# Patient Record
Sex: Female | Born: 1935 | Race: Black or African American | Hispanic: No | Marital: Married | Smoking: Never smoker
Health system: Southern US, Community
[De-identification: ages and names within clinical notes are randomized; demographics above are authoritative.]

## PROBLEM LIST (undated history)

## (undated) DIAGNOSIS — E785 Hyperlipidemia, unspecified: Secondary | ICD-10-CM

## (undated) DIAGNOSIS — K219 Gastro-esophageal reflux disease without esophagitis: Secondary | ICD-10-CM

## (undated) DIAGNOSIS — T45515A Adverse effect of anticoagulants, initial encounter: Secondary | ICD-10-CM

## (undated) DIAGNOSIS — D6832 Hemorrhagic disorder due to extrinsic circulating anticoagulants: Secondary | ICD-10-CM

## (undated) DIAGNOSIS — N189 Chronic kidney disease, unspecified: Secondary | ICD-10-CM

## (undated) DIAGNOSIS — D649 Anemia, unspecified: Secondary | ICD-10-CM

## (undated) DIAGNOSIS — I10 Essential (primary) hypertension: Secondary | ICD-10-CM

## (undated) HISTORY — PX: PACEMAKER INSERTION: SHX728

## (undated) HISTORY — PX: AORTIC VALVE REPLACEMENT: SHX41

## (undated) HISTORY — DX: Essential (primary) hypertension: I10

## (undated) HISTORY — DX: Hyperlipidemia, unspecified: E78.5

## (undated) HISTORY — DX: Adverse effect of anticoagulants, initial encounter: T45.515A

## (undated) HISTORY — DX: Hemorrhagic disorder due to extrinsic circulating anticoagulants: D68.32

## (undated) HISTORY — DX: Anemia, unspecified: D64.9

## (undated) HISTORY — DX: Gastro-esophageal reflux disease without esophagitis: K21.9

## (undated) HISTORY — DX: Chronic kidney disease, unspecified: N18.9

---

## 2003-11-19 ENCOUNTER — Other Ambulatory Visit: Payer: Self-pay

## 2004-06-28 ENCOUNTER — Emergency Department: Payer: Self-pay | Admitting: Emergency Medicine

## 2004-06-28 ENCOUNTER — Ambulatory Visit: Payer: Self-pay | Admitting: Urology

## 2005-07-26 ENCOUNTER — Ambulatory Visit: Payer: Self-pay | Admitting: Internal Medicine

## 2006-08-16 ENCOUNTER — Ambulatory Visit: Payer: Self-pay | Admitting: Internal Medicine

## 2007-09-20 ENCOUNTER — Ambulatory Visit: Payer: Self-pay | Admitting: Internal Medicine

## 2008-12-02 ENCOUNTER — Ambulatory Visit: Payer: Self-pay | Admitting: Internal Medicine

## 2009-04-24 ENCOUNTER — Ambulatory Visit: Payer: Self-pay | Admitting: Internal Medicine

## 2009-05-08 ENCOUNTER — Ambulatory Visit: Payer: Self-pay | Admitting: Internal Medicine

## 2009-05-25 ENCOUNTER — Ambulatory Visit: Payer: Self-pay | Admitting: Internal Medicine

## 2009-07-10 ENCOUNTER — Ambulatory Visit: Payer: Self-pay | Admitting: Unknown Physician Specialty

## 2009-07-25 ENCOUNTER — Ambulatory Visit: Payer: Self-pay | Admitting: Internal Medicine

## 2009-07-31 ENCOUNTER — Ambulatory Visit: Payer: Self-pay | Admitting: Internal Medicine

## 2009-08-25 ENCOUNTER — Ambulatory Visit: Payer: Self-pay | Admitting: Internal Medicine

## 2009-09-22 ENCOUNTER — Ambulatory Visit: Payer: Self-pay | Admitting: Internal Medicine

## 2009-10-05 ENCOUNTER — Ambulatory Visit: Payer: Self-pay | Admitting: Internal Medicine

## 2009-10-23 ENCOUNTER — Ambulatory Visit: Payer: Self-pay | Admitting: Internal Medicine

## 2009-11-04 ENCOUNTER — Ambulatory Visit: Payer: Self-pay | Admitting: Internal Medicine

## 2009-12-07 ENCOUNTER — Ambulatory Visit: Payer: Self-pay | Admitting: Internal Medicine

## 2009-12-23 ENCOUNTER — Ambulatory Visit: Payer: Self-pay | Admitting: Internal Medicine

## 2010-06-04 ENCOUNTER — Ambulatory Visit: Payer: Self-pay | Admitting: Internal Medicine

## 2010-07-12 ENCOUNTER — Ambulatory Visit: Payer: Self-pay | Admitting: Internal Medicine

## 2010-07-17 ENCOUNTER — Other Ambulatory Visit: Payer: Self-pay | Admitting: Internal Medicine

## 2011-03-14 ENCOUNTER — Ambulatory Visit (INDEPENDENT_AMBULATORY_CARE_PROVIDER_SITE_OTHER): Payer: Medicare Other | Admitting: Internal Medicine

## 2011-03-14 DIAGNOSIS — Z7901 Long term (current) use of anticoagulants: Secondary | ICD-10-CM

## 2011-03-15 NOTE — Progress Notes (Signed)
  Subjective:    Patient ID: Katie Mcintosh, female    DOB: 05-06-1934, 77 y.o.   MRN: [id number]  HPI  Patient is here for a PT/INR only lab draw    Review of Systems     Objective:   Physical Exam        Assessment & Plan:

## 2011-03-16 NOTE — Progress Notes (Signed)
Patient notified. Appt scheduled for 1 month.

## 2011-03-29 ENCOUNTER — Other Ambulatory Visit: Payer: Self-pay | Admitting: Internal Medicine

## 2011-03-29 MED ORDER — EZETIMIBE 10 MG PO TABS: 10.0000 mg | ORAL_TABLET | Freq: Every day | ORAL | Status: DC

## 2011-04-06 ENCOUNTER — Encounter: Payer: Self-pay | Admitting: Internal Medicine

## 2011-04-18 ENCOUNTER — Other Ambulatory Visit (INDEPENDENT_AMBULATORY_CARE_PROVIDER_SITE_OTHER): Payer: Medicare Other | Admitting: *Deleted

## 2011-04-18 DIAGNOSIS — Z7901 Long term (current) use of anticoagulants: Secondary | ICD-10-CM

## 2011-04-18 LAB — PROTIME-INR
INR: 2.21 — ABNORMAL HIGH (ref <1.50)
Prothrombin Time: 25.3 seconds — ABNORMAL HIGH (ref 11.6–15.2)

## 2011-04-18 NOTE — Progress Notes (Signed)
Addended by: Alois Cliche on: 04/18/2011 02:32 PM   Modules accepted: Orders

## 2011-04-27 ENCOUNTER — Other Ambulatory Visit: Payer: Self-pay | Admitting: Internal Medicine

## 2011-04-27 MED ORDER — WARFARIN SODIUM 6 MG PO TABS: 6.0000 mg | ORAL_TABLET | Freq: Every day | ORAL | Status: DC

## 2011-04-29 ENCOUNTER — Other Ambulatory Visit: Payer: Self-pay | Admitting: Internal Medicine

## 2011-04-29 MED ORDER — WARFARIN SODIUM 6 MG PO TABS: 6.0000 mg | ORAL_TABLET | Freq: Every day | ORAL | Status: DC

## 2011-05-18 ENCOUNTER — Other Ambulatory Visit (INDEPENDENT_AMBULATORY_CARE_PROVIDER_SITE_OTHER): Payer: Medicare Other | Admitting: *Deleted

## 2011-05-18 DIAGNOSIS — Z7901 Long term (current) use of anticoagulants: Secondary | ICD-10-CM

## 2011-05-18 LAB — PROTIME-INR: INR: 3 ratio — ABNORMAL HIGH (ref 0.8–1.0)

## 2011-06-13 ENCOUNTER — Telehealth: Payer: Self-pay | Admitting: Internal Medicine

## 2011-06-13 DIAGNOSIS — Z1239 Encounter for other screening for malignant neoplasm of breast: Secondary | ICD-10-CM

## 2011-06-13 NOTE — Telephone Encounter (Signed)
Is it okay to put in order and fax to [facility]?

## 2011-06-13 NOTE — Telephone Encounter (Signed)
Yes, i put order for mamogram  on [facility] desk

## 2011-06-14 NOTE — Telephone Encounter (Signed)
Order faxed.

## 2011-06-20 ENCOUNTER — Other Ambulatory Visit (INDEPENDENT_AMBULATORY_CARE_PROVIDER_SITE_OTHER): Payer: Medicare Other | Admitting: *Deleted

## 2011-06-20 DIAGNOSIS — Z7901 Long term (current) use of anticoagulants: Secondary | ICD-10-CM

## 2011-06-20 LAB — PROTIME-INR
INR: 8.6 ratio (ref 0.8–1.0)
Prothrombin Time: 96.5 s (ref 10.2–12.4)

## 2011-06-22 ENCOUNTER — Other Ambulatory Visit (INDEPENDENT_AMBULATORY_CARE_PROVIDER_SITE_OTHER): Payer: Medicare Other | Admitting: *Deleted

## 2011-06-22 DIAGNOSIS — Z7901 Long term (current) use of anticoagulants: Secondary | ICD-10-CM

## 2011-06-22 LAB — PROTIME-INR: Prothrombin Time: 48.8 s — ABNORMAL HIGH (ref 10.2–12.4)

## 2011-06-23 ENCOUNTER — Other Ambulatory Visit: Payer: Self-pay | Admitting: Internal Medicine

## 2011-06-23 MED ORDER — WARFARIN SODIUM 5 MG PO TABS: 5.0000 mg | ORAL_TABLET | Freq: Every day | ORAL | Status: DC

## 2011-06-30 ENCOUNTER — Other Ambulatory Visit (INDEPENDENT_AMBULATORY_CARE_PROVIDER_SITE_OTHER): Payer: Medicare Other | Admitting: *Deleted

## 2011-06-30 DIAGNOSIS — Z7901 Long term (current) use of anticoagulants: Secondary | ICD-10-CM

## 2011-07-01 LAB — PROTIME-INR
INR: 1.7 ratio — ABNORMAL HIGH (ref 0.8–1.0)
Prothrombin Time: 18.9 s — ABNORMAL HIGH (ref 10.2–12.4)

## 2011-07-01 NOTE — Progress Notes (Signed)
Addended by: Alois Cliche on: 07/01/2011 10:24 AM   Modules accepted: Orders

## 2011-07-11 DIAGNOSIS — Z952 Presence of prosthetic heart valve: Secondary | ICD-10-CM | POA: Insufficient documentation

## 2011-07-11 DIAGNOSIS — I10 Essential (primary) hypertension: Secondary | ICD-10-CM | POA: Insufficient documentation

## 2011-07-11 DIAGNOSIS — I219 Acute myocardial infarction, unspecified: Secondary | ICD-10-CM | POA: Insufficient documentation

## 2011-07-11 DIAGNOSIS — I5042 Chronic combined systolic (congestive) and diastolic (congestive) heart failure: Secondary | ICD-10-CM | POA: Insufficient documentation

## 2011-07-11 DIAGNOSIS — I2510 Atherosclerotic heart disease of native coronary artery without angina pectoris: Secondary | ICD-10-CM | POA: Insufficient documentation

## 2011-07-11 DIAGNOSIS — Z950 Presence of cardiac pacemaker: Secondary | ICD-10-CM | POA: Insufficient documentation

## 2011-07-11 DIAGNOSIS — I482 Chronic atrial fibrillation, unspecified: Secondary | ICD-10-CM | POA: Insufficient documentation

## 2011-07-15 ENCOUNTER — Other Ambulatory Visit (INDEPENDENT_AMBULATORY_CARE_PROVIDER_SITE_OTHER): Payer: Medicare Other | Admitting: *Deleted

## 2011-07-15 DIAGNOSIS — Z7901 Long term (current) use of anticoagulants: Secondary | ICD-10-CM

## 2011-07-15 LAB — PROTIME-INR: Prothrombin Time: 40.7 s — ABNORMAL HIGH (ref 10.2–12.4)

## 2011-07-25 ENCOUNTER — Telehealth: Payer: Self-pay | Admitting: *Deleted

## 2011-07-25 DIAGNOSIS — Z7901 Long term (current) use of anticoagulants: Secondary | ICD-10-CM

## 2011-07-25 NOTE — Telephone Encounter (Signed)
I am confused.  She was advised by me through her husband on 12/21 to change her dose to 5 mg daily bc her inr was too high,  And repeat in 2 weeks.  Please clarify what has transpires since  Then bc nothing else is charted

## 2011-07-25 NOTE — Telephone Encounter (Signed)
Her husband didn't give her your instructions. She says he told her that she was to stop coumadin all together.

## 2011-07-25 NOTE — Telephone Encounter (Signed)
Advised pt of protime results.  She says she has not had any coumadin since Wednesday 12/26.  She had been taking 6 mg's daily except on wed and thursdays, when she takes 9 mg's.  She thinks this is too high a dose and thinks she needs to take a lower dose.  Please advise.

## 2011-07-25 NOTE — Telephone Encounter (Signed)
Ask her to take 9 mg tonight, since she is now probably undertherapeutic,  And then start 6 mg daily tomorrow,  Return for PT/INR next Tuesday,  thanks

## 2011-07-25 NOTE — Telephone Encounter (Signed)
Advised pt, lab appt scheduled for next week.

## 2011-08-02 ENCOUNTER — Telehealth: Payer: Self-pay | Admitting: *Deleted

## 2011-08-02 ENCOUNTER — Other Ambulatory Visit (INDEPENDENT_AMBULATORY_CARE_PROVIDER_SITE_OTHER): Payer: Medicare Other | Admitting: *Deleted

## 2011-08-02 DIAGNOSIS — Z7901 Long term (current) use of anticoagulants: Secondary | ICD-10-CM

## 2011-08-02 LAB — PROTIME-INR
INR: 2.1 ratio — ABNORMAL HIGH (ref 0.8–1.0)
Prothrombin Time: 23.1 s — ABNORMAL HIGH (ref 10.2–12.4)

## 2011-08-02 NOTE — Telephone Encounter (Signed)
Pt has brought in form for handicapped placard, form is in your red folder.

## 2011-08-03 NOTE — Telephone Encounter (Signed)
Completed form placed up front for pick up, pt advised.

## 2011-08-04 NOTE — Progress Notes (Signed)
Addended by: Deborra Medina on: 08/04/2011 01:17 PM   Modules accepted: Orders

## 2011-08-11 ENCOUNTER — Telehealth: Payer: Self-pay | Admitting: Internal Medicine

## 2011-08-11 NOTE — Telephone Encounter (Signed)
thank you !!

## 2011-08-11 NOTE — Telephone Encounter (Signed)
Spoke with patient, she has dry cough, sneezing, chest feels tight in the morning. Symptoms x one week.  No fever.  Advised pt it sounds like she has a cold.  Suggested that she try mucous relief expectorant, along with plenty of fluids, for cough and congestion. She will call back if not better.

## 2011-08-11 NOTE — Telephone Encounter (Signed)
Cold in head and chest wants something called into the pharmacy

## 2011-08-22 ENCOUNTER — Ambulatory Visit: Payer: Self-pay | Admitting: Internal Medicine

## 2011-08-22 LAB — HM MAMMOGRAPHY: HM Mammogram: NORMAL

## 2011-08-25 ENCOUNTER — Other Ambulatory Visit: Payer: Self-pay | Admitting: *Deleted

## 2011-08-25 MED ORDER — EZETIMIBE 10 MG PO TABS: 10.0000 mg | ORAL_TABLET | Freq: Every day | ORAL | Status: DC

## 2011-08-25 MED ORDER — CARVEDILOL 12.5 MG PO TABS: 12.5000 mg | ORAL_TABLET | Freq: Two times a day (BID) | ORAL | Status: DC

## 2011-09-05 ENCOUNTER — Other Ambulatory Visit (INDEPENDENT_AMBULATORY_CARE_PROVIDER_SITE_OTHER): Payer: Medicare Other | Admitting: *Deleted

## 2011-09-05 ENCOUNTER — Telehealth: Payer: Self-pay | Admitting: *Deleted

## 2011-09-05 DIAGNOSIS — Z7901 Long term (current) use of anticoagulants: Secondary | ICD-10-CM

## 2011-09-05 NOTE — Telephone Encounter (Signed)
Santiago Glad from [facility] lab called - PT 68.7 INR 6.2. I called patient. She has not changed her diet, not on any antibiotics or other med changes and currently taking coumadin 6 mg on all days except Wed which she takes 6.5 mg. She has not taken her coumadin yet today.

## 2011-09-05 NOTE — Telephone Encounter (Signed)
Patient informed, scheduled for lab Thursday

## 2011-09-05 NOTE — Telephone Encounter (Signed)
Thank you.  Please have her suspend all coumadin and have her PT/INR rechecked on Thursday

## 2011-09-08 ENCOUNTER — Other Ambulatory Visit (INDEPENDENT_AMBULATORY_CARE_PROVIDER_SITE_OTHER): Payer: Medicare Other | Admitting: *Deleted

## 2011-09-08 DIAGNOSIS — Z7901 Long term (current) use of anticoagulants: Secondary | ICD-10-CM

## 2011-09-12 ENCOUNTER — Encounter: Payer: Self-pay | Admitting: Internal Medicine

## 2011-09-12 ENCOUNTER — Ambulatory Visit (INDEPENDENT_AMBULATORY_CARE_PROVIDER_SITE_OTHER): Payer: Medicare Other | Admitting: Internal Medicine

## 2011-09-12 VITALS — BP 128/68 | HR 68 | Temp 98.2°F | Wt 158.0 lb

## 2011-09-12 DIAGNOSIS — Z13820 Encounter for screening for osteoporosis: Secondary | ICD-10-CM

## 2011-09-12 DIAGNOSIS — I10 Essential (primary) hypertension: Secondary | ICD-10-CM

## 2011-09-12 DIAGNOSIS — K219 Gastro-esophageal reflux disease without esophagitis: Secondary | ICD-10-CM

## 2011-09-12 DIAGNOSIS — E785 Hyperlipidemia, unspecified: Secondary | ICD-10-CM

## 2011-09-12 DIAGNOSIS — D649 Anemia, unspecified: Secondary | ICD-10-CM

## 2011-09-12 DIAGNOSIS — N189 Chronic kidney disease, unspecified: Secondary | ICD-10-CM | POA: Insufficient documentation

## 2011-09-12 DIAGNOSIS — E875 Hyperkalemia: Secondary | ICD-10-CM

## 2011-09-12 DIAGNOSIS — Z7901 Long term (current) use of anticoagulants: Secondary | ICD-10-CM

## 2011-09-12 DIAGNOSIS — N289 Disorder of kidney and ureter, unspecified: Secondary | ICD-10-CM

## 2011-09-12 NOTE — Assessment & Plan Note (Signed)
Secondary to CKD, with negative FOBT in July 2012.

## 2011-09-12 NOTE — Assessment & Plan Note (Signed)
Well controlled currently; no changes today

## 2011-09-12 NOTE — Assessment & Plan Note (Signed)
Managed with Zetia,  With no lipids in over 6 months

## 2011-09-12 NOTE — Assessment & Plan Note (Signed)
Managed with daily omeprazole

## 2011-09-12 NOTE — Patient Instructions (Signed)
Pleasefreturn in 2 weeks for a coumadin check,  And followup with Dr Candiss Norse on your anemia  We will call you with your potassium level.,

## 2011-09-12 NOTE — Assessment & Plan Note (Signed)
Mild, by December labs.  May be due to diet and CKD since she is not taking any medications which are responsible Repeating today

## 2011-09-12 NOTE — Progress Notes (Signed)
Subjective:    Patient ID: Katie Mcintosh, female    DOB: 10/12/1933, 78 y.o.   MRN: [id number]  HPI  Katie Mcintosh is a 78 yr old AA female with a history of aortic valve replacement, on chronic coumadin, cardiomyopathy with reduced LV function who is  here to followup on chronic disease including CKD, aortic valve replacement and DJD.  She had d labs in december from [place]  which were notable for anemia and mild hyperkalemia, with Cr  of 1.8   .Her cc insomnia with nocturia x 3, chronic low back and knee pain.  Not exercising or walking regularly  Gets short of breath with vacuuming.   Past Medical History  Diagnosis Date  . Chronic kidney disease   . Anemia   . Hyperlipidemia   . GERD (gastroesophageal reflux disease)   . Hypertension    Current Outpatient Prescriptions on File Prior to Visit  Medication Sig Dispense Refill  . carvedilol (COREG) 12.5 MG tablet Take 1 tablet (12.5 mg total) by mouth 2 (two) times daily with a meal.  60 tablet  3  . ezetimibe (ZETIA) 10 MG tablet Take 1 tablet (10 mg total) by mouth daily.  30 tablet  3  . warfarin (COUMADIN) 5 MG tablet Take 6 mg by mouth daily. Take 9 mg on Wednesday s, 6 mg all other days         Review of Systems  Constitutional: Negative for fever, chills and unexpected weight change.  HENT: Negative for hearing loss, ear pain, nosebleeds, congestion, sore throat, facial swelling, rhinorrhea, sneezing, mouth sores, trouble swallowing, neck pain, neck stiffness, voice change, postnasal drip, sinus pressure, tinnitus and ear discharge.   Eyes: Negative for pain, discharge, redness and visual disturbance.  Respiratory: Positive for shortness of breath. Negative for cough, chest tightness, wheezing and stridor.   Cardiovascular: Negative for chest pain, palpitations and leg swelling.  Musculoskeletal: Positive for arthralgias. Negative for myalgias.  Skin: Negative for color change and rash.  Neurological: Negative for dizziness,  weakness, light-headedness and headaches.  Hematological: Negative for adenopathy.       Objective:   Physical Exam  Constitutional: She is oriented to person, place, and time. She appears well-developed and well-nourished.  HENT:  Mouth/Throat: Oropharynx is clear and moist.  Eyes: EOM are normal. Pupils are equal, round, and reactive to light. No scleral icterus.  Neck: Normal range of motion. Neck supple. No JVD present. No thyromegaly present.  Cardiovascular: Normal rate, regular rhythm, normal heart sounds and intact distal pulses.   Pulmonary/Chest: Effort normal and breath sounds normal.  Abdominal: Soft. Bowel sounds are normal. She exhibits no mass. There is no tenderness.  Musculoskeletal: Normal range of motion. She exhibits no edema.  Lymphadenopathy:    She has no cervical adenopathy.  Neurological: She is alert and oriented to person, place, and time.  Skin: Skin is warm and dry.  Psychiatric: She has a normal mood and affect.       Assessment & Plan:   Anemia Secondary to CKD, with negative FOBT in July 2012.     GERD (gastroesophageal reflux disease) Managed with daily omeprazole  Hyperlipidemia Managed with Zetia,  With no lipids in over 6 months  Hypertension Well controlled currently; no changes today  Hyperkalemia Mild, by December labs.  May be due to diet and CKD since she is not taking any medications which are responsible Repeating today    Updated Medication List Outpatient Encounter Prescriptions as of 09/12/2011  Medication Sig Dispense Refill  . amiloride-hydrochlorothiazide (MODURETIC) 5-50 MG tablet Take 1 tablet by mouth daily.      . carvedilol (COREG) 12.5 MG tablet Take 1 tablet (12.5 mg total) by mouth 2 (two) times daily with a meal.  60 tablet  3  . ezetimibe (ZETIA) 10 MG tablet Take 1 tablet (10 mg total) by mouth daily.  30 tablet  3  . omeprazole (PRILOSEC) 20 MG capsule Take 20 mg by mouth daily.      . sodium bicarbonate  650 MG tablet Take 650 mg by mouth 2 (two) times daily.      Marland Kitchen warfarin (COUMADIN) 5 MG tablet Take 6 mg by mouth daily. Take 9 mg on Wednesday s, 6 mg all other days

## 2011-09-13 ENCOUNTER — Other Ambulatory Visit: Payer: Self-pay | Admitting: Internal Medicine

## 2011-09-13 LAB — COMPLETE METABOLIC PANEL WITH GFR
ALT: 13 U/L (ref 0–35)
Albumin: 4.2 g/dL (ref 3.5–5.2)
CO2: 22 mEq/L (ref 19–32)
Calcium: 9.3 mg/dL (ref 8.4–10.5)
Chloride: 110 mEq/L (ref 96–112)
GFR, Est African American: 29 mL/min — ABNORMAL LOW
Potassium: 5.8 mEq/L — ABNORMAL HIGH (ref 3.5–5.3)
Sodium: 142 mEq/L (ref 135–145)
Total Bilirubin: 0.4 mg/dL (ref 0.3–1.2)
Total Protein: 7.4 g/dL (ref 6.0–8.3)

## 2011-09-13 NOTE — Patient Instructions (Signed)
Potassium Content of Foods Potassium is a mineral. The body needs potassium to control blood pressure and to keep the muscles and nervous system healthy. Most foods contain potassium. Eating a variety of foods in the right amounts will help control the level of potassium in your body. Kidney problems can cause there to be too much potassium in the body. If this happens, you may need to lower the amount of potassium in your diet. Some medicines such as diuretics may cause your body to lose too much potassium. If this happens, you may need to increase the amount of potassium in your diet. COMMON SERVING SIZES The list below tells you how big or small some common portion sizes are:  1 oz.........4 stacked dice.     3 oz........Marland KitchenDeck of cards.     1 tsp.......Marland KitchenTip of little finger.     1 tbs......Marland KitchenMarland KitchenThumb.     2 tbs.......Marland KitchenGolf ball.      cup......Marland KitchenHalf of a fist.     1 cup.......Marland KitchenA fist.  FOODS HIGH IN POTASSIUM More than 250 mg per serving.  Bran cereals and other bran products.     Milk (skim, 1%, 2%, whole).     Buttermilk.    Yogurt.    Avocados.    Bananas.    Dried fruits.     Kiwis.    Oranges.    Prunes.    Raisins.    Baked beans.     Spinach.    Tomatoes.    Peanut butter.     Nuts.    Tofu.    Potatoes.  FOODS MODERATE IN POTASSIUM Between 150 to 250mg  per serving.  Cherries.     Mangoes.    Asparagus.    Peas.    Zucchini.    Celery.    [place].    Peaches, fresh.     Broccoli stalks.     Peppers.    Figs.    Pears, fresh.     Kale.    Summer squashes.  FOODS LOW IN POTASSIUM Less than 150mg  per serving.  Pasta.     Rice.    Cottage cheese.     Cheddar cheese.     Apples.    Grapes.    Pineapple.    Raspberries.    Strawberries.    Watermelon.    Green beans.     Cabbage.    Cauliflower.    Corn.    Mushrooms.    Onions.    Eggs.  Document Released: 02/22/2005 Document Revised:  03/23/2011 Document Reviewed: 06/19/2007 [facility] Patient Information 2012 [place].

## 2011-09-15 ENCOUNTER — Telehealth: Payer: Self-pay | Admitting: Internal Medicine

## 2011-09-15 ENCOUNTER — Other Ambulatory Visit: Payer: Self-pay | Admitting: *Deleted

## 2011-09-15 MED ORDER — OMEPRAZOLE 20 MG PO CPDR: 20.0000 mg | DELAYED_RELEASE_CAPSULE | Freq: Every day | ORAL | Status: AC

## 2011-09-15 NOTE — Telephone Encounter (Signed)
Katie Mcintosh has taken care of this.

## 2011-09-15 NOTE — Telephone Encounter (Signed)
Office Message [address] [place], [place] [address] p. [phone] f. [phone] To: [place] (Daytime Triage) Fax: [phone] From: [facility] Date/ Time: 09/15/2011 10:13 AM Taken By: Ria Comment Renegar, CSR Caller: [place]: not collected Patient: Katie, Mcintosh DOB: 1934-05-10 Phone: [phone] Reason for Call: Had a call from the office. She asked to have Dr. Lupita Dawn nurse call her back at [phone] Regarding Appointment: Appt Date: Appt Time: Unknown Provider: Reason: Details: Outcome

## 2011-09-26 ENCOUNTER — Other Ambulatory Visit: Payer: Medicare Other

## 2011-10-11 ENCOUNTER — Other Ambulatory Visit (INDEPENDENT_AMBULATORY_CARE_PROVIDER_SITE_OTHER): Payer: Medicare Other | Admitting: *Deleted

## 2011-10-11 DIAGNOSIS — Z7901 Long term (current) use of anticoagulants: Secondary | ICD-10-CM

## 2011-10-11 LAB — PROTIME-INR
INR: 4.2 ratio — ABNORMAL HIGH (ref 0.8–1.0)
Prothrombin Time: 46.2 s — ABNORMAL HIGH (ref 10.2–12.4)

## 2011-10-26 ENCOUNTER — Telehealth: Payer: Self-pay | Admitting: Internal Medicine

## 2011-10-26 ENCOUNTER — Other Ambulatory Visit (INDEPENDENT_AMBULATORY_CARE_PROVIDER_SITE_OTHER): Payer: Medicare Other | Admitting: *Deleted

## 2011-10-26 DIAGNOSIS — Z7901 Long term (current) use of anticoagulants: Secondary | ICD-10-CM

## 2011-10-26 NOTE — Telephone Encounter (Signed)
Santiago Glad called on patient for a critical lab.  PT: 53.00    INR: 4.76

## 2011-10-26 NOTE — Telephone Encounter (Signed)
I have left two messages on two different phone number for patient to return my call as soon as possible.

## 2011-10-26 NOTE — Telephone Encounter (Signed)
Her coumadin level is way too high.  Stop the coumaidn repeat INR on Friday

## 2011-10-27 NOTE — Telephone Encounter (Signed)
Patient did not return my call, I called her back this morning and advised her to stop the coumadin and repeat her lab tomorrow.

## 2011-10-28 ENCOUNTER — Telehealth: Payer: Self-pay | Admitting: Internal Medicine

## 2011-10-28 ENCOUNTER — Other Ambulatory Visit: Payer: Medicare Other

## 2011-10-28 ENCOUNTER — Other Ambulatory Visit (INDEPENDENT_AMBULATORY_CARE_PROVIDER_SITE_OTHER): Payer: Medicare Other

## 2011-10-28 DIAGNOSIS — Z7901 Long term (current) use of anticoagulants: Secondary | ICD-10-CM

## 2011-10-28 DIAGNOSIS — I10 Essential (primary) hypertension: Secondary | ICD-10-CM

## 2011-10-28 NOTE — Telephone Encounter (Signed)
Per Dr. Derrel Nip patient is to be off of coumadin over the weekend and recheck PT/INR on Monday.  Patient has been notified.

## 2011-10-28 NOTE — Telephone Encounter (Signed)
Lab called for critical on patient   PT: 55.9   INR: 5.0

## 2011-10-31 ENCOUNTER — Other Ambulatory Visit (INDEPENDENT_AMBULATORY_CARE_PROVIDER_SITE_OTHER): Payer: Medicare Other | Admitting: *Deleted

## 2011-10-31 DIAGNOSIS — Z7901 Long term (current) use of anticoagulants: Secondary | ICD-10-CM

## 2011-11-01 MED ORDER — WARFARIN SODIUM 5 MG PO TABS: 5.0000 mg | ORAL_TABLET | Freq: Every day | ORAL | Status: DC

## 2011-11-01 NOTE — Progress Notes (Signed)
Addended by: Gildardo Pounds A on: 11/01/2011 03:56 PM   Modules accepted: Orders

## 2011-11-14 ENCOUNTER — Other Ambulatory Visit (INDEPENDENT_AMBULATORY_CARE_PROVIDER_SITE_OTHER): Payer: Medicare Other | Admitting: *Deleted

## 2011-11-14 DIAGNOSIS — Z7901 Long term (current) use of anticoagulants: Secondary | ICD-10-CM

## 2011-11-14 LAB — PROTIME-INR
INR: 2 ratio — ABNORMAL HIGH (ref 0.8–1.0)
Prothrombin Time: 21.6 s — ABNORMAL HIGH (ref 10.2–12.4)

## 2011-11-29 ENCOUNTER — Other Ambulatory Visit (INDEPENDENT_AMBULATORY_CARE_PROVIDER_SITE_OTHER): Payer: Medicare Other | Admitting: *Deleted

## 2011-11-29 ENCOUNTER — Other Ambulatory Visit: Payer: Self-pay | Admitting: Internal Medicine

## 2011-11-29 DIAGNOSIS — Z7901 Long term (current) use of anticoagulants: Secondary | ICD-10-CM

## 2011-11-29 LAB — PROTIME-INR: INR: 3.9 ratio — ABNORMAL HIGH (ref 0.8–1.0)

## 2011-11-29 MED ORDER — WARFARIN SODIUM 4 MG PO TABS: 4.0000 mg | ORAL_TABLET | Freq: Every day | ORAL | Status: DC

## 2011-12-12 ENCOUNTER — Other Ambulatory Visit (INDEPENDENT_AMBULATORY_CARE_PROVIDER_SITE_OTHER): Payer: Medicare Other | Admitting: *Deleted

## 2011-12-12 DIAGNOSIS — Z7901 Long term (current) use of anticoagulants: Secondary | ICD-10-CM

## 2011-12-12 LAB — PROTIME-INR: Prothrombin Time: 15.6 s — ABNORMAL HIGH (ref 10.2–12.4)

## 2011-12-13 ENCOUNTER — Other Ambulatory Visit: Payer: Self-pay | Admitting: Internal Medicine

## 2011-12-13 MED ORDER — WARFARIN SODIUM 4 MG PO TABS: ORAL_TABLET | ORAL | Status: DC

## 2011-12-26 ENCOUNTER — Other Ambulatory Visit (INDEPENDENT_AMBULATORY_CARE_PROVIDER_SITE_OTHER): Payer: Medicare Other | Admitting: *Deleted

## 2011-12-26 DIAGNOSIS — Z7901 Long term (current) use of anticoagulants: Secondary | ICD-10-CM

## 2011-12-26 LAB — PROTIME-INR: Prothrombin Time: 21.8 s — ABNORMAL HIGH (ref 10.2–12.4)

## 2011-12-27 ENCOUNTER — Encounter: Payer: Self-pay | Admitting: Internal Medicine

## 2011-12-27 ENCOUNTER — Other Ambulatory Visit: Payer: Self-pay | Admitting: *Deleted

## 2011-12-27 MED ORDER — WARFARIN SODIUM 4 MG PO TABS: ORAL_TABLET | ORAL | Status: DC

## 2011-12-27 NOTE — Telephone Encounter (Signed)
Patient requested Rx refill.

## 2012-01-04 ENCOUNTER — Encounter: Payer: Self-pay | Admitting: Internal Medicine

## 2012-01-11 ENCOUNTER — Other Ambulatory Visit (INDEPENDENT_AMBULATORY_CARE_PROVIDER_SITE_OTHER): Payer: Medicare Other | Admitting: *Deleted

## 2012-01-11 DIAGNOSIS — Z7901 Long term (current) use of anticoagulants: Secondary | ICD-10-CM

## 2012-01-11 LAB — PROTIME-INR
INR: 3.4 ratio — ABNORMAL HIGH (ref 0.8–1.0)
Prothrombin Time: 37.7 s — ABNORMAL HIGH (ref 10.2–12.4)

## 2012-01-24 ENCOUNTER — Telehealth: Payer: Self-pay | Admitting: Internal Medicine

## 2012-01-24 MED ORDER — CARVEDILOL 12.5 MG PO TABS: 12.5000 mg | ORAL_TABLET | Freq: Two times a day (BID) | ORAL | Status: AC

## 2012-01-24 NOTE — Telephone Encounter (Signed)
Patient came by office she is out of her Carvedilol 12.5 mg, and her warfarin sodium 4mg , she uses [facility], she states that she was suppose to get these through mail.  That is what she was told last week, [facility] she isn't sure where they are located they are a mail order, through her [facility] plan.  She is completely out, please call this months into [facility] and next month send to the mail order company.  Please call patient when this has been done.

## 2012-01-24 NOTE — Telephone Encounter (Signed)
We can't pull up Katie Mcintosh in the system and it would be best for her to pick up the rxs and mail them in. She just wants them to be sent to [facility] . She doesn't need the carvdilol so rx for warfarin sent in.

## 2012-02-06 ENCOUNTER — Other Ambulatory Visit: Payer: Self-pay | Admitting: Internal Medicine

## 2012-02-09 ENCOUNTER — Other Ambulatory Visit (INDEPENDENT_AMBULATORY_CARE_PROVIDER_SITE_OTHER): Payer: Medicare Other | Admitting: *Deleted

## 2012-02-09 DIAGNOSIS — Z7901 Long term (current) use of anticoagulants: Secondary | ICD-10-CM

## 2012-02-09 DIAGNOSIS — Z79899 Other long term (current) drug therapy: Secondary | ICD-10-CM

## 2012-02-09 LAB — PROTIME-INR: Prothrombin Time: 12.3 s (ref 10.2–12.4)

## 2012-02-10 ENCOUNTER — Telehealth: Payer: Self-pay | Admitting: Internal Medicine

## 2012-02-10 NOTE — Telephone Encounter (Signed)
Daughter called in and states that her moms home phone is out and would like you to call her on the cell of daughters number when you call her back to day.  Daughter states that patient was in yesterday and was told she would more than likely get a call back today mid day.

## 2012-02-10 NOTE — Telephone Encounter (Signed)
Will call daughter's number when Dr. Derrel Nip answers to labs.

## 2012-02-27 ENCOUNTER — Telehealth: Payer: Self-pay | Admitting: *Deleted

## 2012-02-27 ENCOUNTER — Other Ambulatory Visit (INDEPENDENT_AMBULATORY_CARE_PROVIDER_SITE_OTHER): Payer: Medicare Other | Admitting: *Deleted

## 2012-02-27 ENCOUNTER — Other Ambulatory Visit: Payer: Self-pay | Admitting: *Deleted

## 2012-02-27 DIAGNOSIS — Z7901 Long term (current) use of anticoagulants: Secondary | ICD-10-CM

## 2012-02-27 LAB — PROTIME-INR
INR: 6.3 ratio (ref 0.8–1.0)
Prothrombin Time: 70.3 s (ref 10.2–12.4)

## 2012-02-27 MED ORDER — EZETIMIBE 10 MG PO TABS: 10.0000 mg | ORAL_TABLET | Freq: Every day | ORAL | Status: DC

## 2012-02-27 NOTE — Telephone Encounter (Signed)
In regards to note below patient stated that she is not having in bleeding and she will come in in on Thursday for labs.

## 2012-02-27 NOTE — Telephone Encounter (Signed)
Left detailed message notifying patient. Also asked that she call back. If I do not hear from her tonight before I leave, I will try calling her again.

## 2012-02-27 NOTE — Telephone Encounter (Signed)
[facility] lab called with critical pt of 70.3 and INR of 6.29

## 2012-02-27 NOTE — Telephone Encounter (Signed)
Katie Mcintosh's INR is very high She needs to stop her coumadin immediately.  If she is not having any bleeding,  She needs to have a repeat INR on Thursday.  If she is having bleeding, she needs call us back tonight so I can assess.

## 2012-02-27 NOTE — Telephone Encounter (Signed)
Patient returned call and was notified. Lab appt scheduled for Thursday.

## 2012-03-01 ENCOUNTER — Other Ambulatory Visit (INDEPENDENT_AMBULATORY_CARE_PROVIDER_SITE_OTHER): Payer: Medicare Other | Admitting: *Deleted

## 2012-03-01 DIAGNOSIS — Z7901 Long term (current) use of anticoagulants: Secondary | ICD-10-CM

## 2012-03-13 ENCOUNTER — Ambulatory Visit (INDEPENDENT_AMBULATORY_CARE_PROVIDER_SITE_OTHER): Payer: Medicare Other | Admitting: Internal Medicine

## 2012-03-13 ENCOUNTER — Encounter: Payer: Self-pay | Admitting: Internal Medicine

## 2012-03-13 VITALS — BP 142/80 | HR 70 | Temp 98.0°F | Resp 16 | Ht 66.0 in | Wt 156.5 lb

## 2012-03-13 DIAGNOSIS — Z0000 Encounter for general adult medical examination without abnormal findings: Secondary | ICD-10-CM

## 2012-03-13 DIAGNOSIS — E785 Hyperlipidemia, unspecified: Secondary | ICD-10-CM

## 2012-03-13 DIAGNOSIS — E875 Hyperkalemia: Secondary | ICD-10-CM

## 2012-03-13 DIAGNOSIS — Z1211 Encounter for screening for malignant neoplasm of colon: Secondary | ICD-10-CM

## 2012-03-13 DIAGNOSIS — D631 Anemia in chronic kidney disease: Secondary | ICD-10-CM

## 2012-03-13 NOTE — Patient Instructions (Addendum)
[facility] to get your TdaP vaccine (tetanus-diptheria-pertussis) this year  We will refer you to [facility] eye clinic   Please fast before your blood test on the 23rd so I can check your cholesterol

## 2012-03-14 ENCOUNTER — Encounter: Payer: Self-pay | Admitting: Internal Medicine

## 2012-03-14 DIAGNOSIS — Z1211 Encounter for screening for malignant neoplasm of colon: Secondary | ICD-10-CM | POA: Insufficient documentation

## 2012-03-14 NOTE — Progress Notes (Signed)
Patient ID: Katie Mcintosh, female   DOB: 1933-12-18, 78 y.o.   MRN: [id number]  The patient is here for annual Medicare wellness examination and management of other chronic and acute problems.   The risk factors are reflected in the social history.  The roster of all physicians providing medical care to patient - is listed in the Snapshot section of the chart.  Activities of daily living:  The patient is 100% independent in all ADLs: dressing, toileting, feeding as well as independent mobility  Home safety : The patient has smoke detectors in the home. They wear seatbelts.  There are no firearms at home. There is no violence in the home.   There is no risks for hepatitis, STDs or HIV. There is no   history of blood transfusion. They have no travel history to infectious disease endemic areas of the world.  The patient has seen their dentist in the last six month. They have seen their eye doctor in the last year. They admit to slight hearing difficulty with regard to whispered voices and some television programs.  They have deferred audiologic testing in the last year.  They do not  have excessive sun exposure. Discussed the need for sun protection: hats, long sleeves and use of sunscreen if there is significant sun exposure.   Diet: the importance of a healthy diet is discussed. They do have a healthy diet.  The benefits of regular aerobic exercise were discussed. She walks 4 times per week ,  20 minutes.   Depression screen: there are no signs or vegative symptoms of depression- irritability, change in appetite, anhedonia, sadness/tearfullness.  Cognitive assessment: the patient manages all their financial and personal affairs and is actively engaged. They could relate day,date,year and events; recalled 2/3 objects at 3 minutes; performed clock-face test normally.  The following portions of the patient's history were reviewed and updated as appropriate: allergies, current medications, past  family history, past medical history,  past surgical history, past social history  and problem list.  Visual acuity was not assessed per patient preference since she has regular follow up with her ophthalmologist. Hearing and body mass index were assessed and reviewed.   During the course of the visit the patient was educated and counseled about appropriate screening and preventive services including : fall prevention , diabetes screening, nutrition counseling, colorectal cancer screening, and recommended immunizations.    BP 142/80  Pulse 70  Temp 98 F (36.7 C) (Oral)  Resp 16  Ht 5\' 6"  (1.676 m)  Wt 156 lb 8 oz (70.988 kg)  BMI 25.26 kg/m2  SpO2 97%   General appearance: alert, cooperative and appears stated age Ears: normal TM's and external ear canals both ears Throat: lips, mucosa, and tongue normal; teeth and gums normal Neck: no adenopathy, no carotid bruit, supple, symmetrical, trachea midline and thyroid not enlarged, symmetric, no tenderness/mass/nodules Back: symmetric, no curvature. ROM normal. No CVA tenderness. Breasts: symmetric, no masses or dimpling Lungs: clear to auscultation bilaterally Heart: regular rate and rhythm, S1, S2 normal, no murmur, click, rub or gallop Abdomen: soft, non-tender; bowel sounds normal; no masses,  no organomegaly Pulses: 2+ and symmetric Skin: Skin color, texture, turgor normal. No rashes or lesions Lymph nodes: Cervical, supraclavicular, and axillary nodes normal. Assessment and Plan  Hyperkalemia Secondary to chronic kidney disease. She was given a low potassium diet at last visit. Her repeat potassiums have been normal per nephrology.  Hyperlipidemia Managed with Zetia. She is due for repeat lipids and  this will be checked prior to her next visit. Goal is LDL less than 100.  Screening for colon cancer Her last colonoscopy was at least 10 years ago. There are no records in the file. She cannot remember exact date. Will refer to  [facility] for followup.   Updated Medication List Outpatient Encounter Prescriptions as of 03/13/2012  Medication Sig Dispense Refill  . carvedilol (COREG) 12.5 MG tablet Take 1 tablet (12.5 mg total) by mouth 2 (two) times daily with a meal.  60 tablet  3  . ezetimibe (ZETIA) 10 MG tablet Take 1 tablet (10 mg total) by mouth daily.  30 tablet  3  . omeprazole (PRILOSEC) 20 MG capsule Take 1 capsule (20 mg total) by mouth daily.  30 capsule  3  . sodium bicarbonate 650 MG tablet Take 650 mg by mouth 2 (two) times daily.      Marland Kitchen warfarin (COUMADIN) 4 MG tablet Take 4 mg by mouth daily.      Marland Kitchen DISCONTD: warfarin (COUMADIN) 4 MG tablet 6mg  on five days and 2mg  on two days.  30 tablet  3  . DISCONTD: amiloride-hydrochlorothiazide (MODURETIC) 5-50 MG tablet Take 1 tablet by mouth daily.

## 2012-03-14 NOTE — Assessment & Plan Note (Signed)
Secondary to chronic kidney disease. She was given a low potassium diet at last visit. Her repeat potassiums have been normal per nephrology.

## 2012-03-14 NOTE — Assessment & Plan Note (Signed)
Her last colonoscopy was at least 10 years ago. There are no records in the file. She cannot remember exact date. Will refer to [facility] for followup.

## 2012-03-14 NOTE — Assessment & Plan Note (Addendum)
Managed with Zetia. She is due for repeat lipids and this will be checked prior to her next visit. Goal is LDL less than 100.

## 2012-03-15 ENCOUNTER — Other Ambulatory Visit (INDEPENDENT_AMBULATORY_CARE_PROVIDER_SITE_OTHER): Payer: Medicare Other | Admitting: *Deleted

## 2012-03-15 DIAGNOSIS — N039 Chronic nephritic syndrome with unspecified morphologic changes: Secondary | ICD-10-CM

## 2012-03-15 DIAGNOSIS — N189 Chronic kidney disease, unspecified: Secondary | ICD-10-CM

## 2012-03-15 DIAGNOSIS — E875 Hyperkalemia: Secondary | ICD-10-CM

## 2012-03-15 DIAGNOSIS — Z7901 Long term (current) use of anticoagulants: Secondary | ICD-10-CM

## 2012-03-15 DIAGNOSIS — E785 Hyperlipidemia, unspecified: Secondary | ICD-10-CM

## 2012-03-15 DIAGNOSIS — D631 Anemia in chronic kidney disease: Secondary | ICD-10-CM

## 2012-03-15 LAB — COMPREHENSIVE METABOLIC PANEL
ALT: 11 U/L (ref 0–35)
AST: 18 U/L (ref 0–37)
CO2: 21 mEq/L (ref 19–32)
Chloride: 112 mEq/L (ref 96–112)
GFR: 29.3 mL/min — ABNORMAL LOW (ref >60.00)
Sodium: 139 mEq/L (ref 135–145)
Total Bilirubin: 0.4 mg/dL (ref 0.3–1.2)
Total Protein: 7.1 g/dL (ref 6.0–8.3)

## 2012-03-15 LAB — CBC WITH DIFFERENTIAL/PLATELET
Basophils Relative: 0.5 % (ref 0.0–3.0)
Eosinophils Relative: 4.4 % (ref 0.0–5.0)
Hemoglobin: 10 g/dL — ABNORMAL LOW (ref 12.0–15.0)
Lymphocytes Relative: 32.6 % (ref 12.0–46.0)
MCHC: 32.2 g/dL (ref 30.0–36.0)
Monocytes Relative: 10.3 % (ref 3.0–12.0)
Neutro Abs: 2.3 10*3/uL (ref 1.4–7.7)
RBC: 3.29 Mil/uL — ABNORMAL LOW (ref 3.87–5.11)
WBC: 4.4 10*3/uL — ABNORMAL LOW (ref 4.5–10.5)

## 2012-03-15 LAB — PROTIME-INR: INR: 1.9 ratio — ABNORMAL HIGH (ref 0.8–1.0)

## 2012-03-15 LAB — LDL CHOLESTEROL, DIRECT: Direct LDL: 130.8 mg/dL

## 2012-03-15 LAB — LIPID PANEL: VLDL: 22 mg/dL (ref 0.0–40.0)

## 2012-03-16 ENCOUNTER — Other Ambulatory Visit: Payer: Self-pay | Admitting: Internal Medicine

## 2012-03-16 ENCOUNTER — Other Ambulatory Visit: Payer: Medicare Other

## 2012-03-16 MED ORDER — WARFARIN SODIUM 4 MG PO TABS: 4.0000 mg | ORAL_TABLET | Freq: Every day | ORAL | Status: DC

## 2012-04-02 ENCOUNTER — Telehealth: Payer: Self-pay | Admitting: *Deleted

## 2012-04-02 ENCOUNTER — Other Ambulatory Visit: Payer: Self-pay | Admitting: Internal Medicine

## 2012-04-02 ENCOUNTER — Other Ambulatory Visit (INDEPENDENT_AMBULATORY_CARE_PROVIDER_SITE_OTHER): Payer: Medicare Other

## 2012-04-02 DIAGNOSIS — Z7901 Long term (current) use of anticoagulants: Secondary | ICD-10-CM

## 2012-04-02 DIAGNOSIS — E875 Hyperkalemia: Secondary | ICD-10-CM

## 2012-04-02 LAB — BASIC METABOLIC PANEL
CO2: 23 mEq/L (ref 19–32)
Chloride: 112 mEq/L (ref 96–112)
Potassium: 4.5 mEq/L (ref 3.5–5.1)
Sodium: 141 mEq/L (ref 135–145)

## 2012-04-02 LAB — PROTIME-INR: Prothrombin Time: 46.1 s — ABNORMAL HIGH (ref 10.2–12.4)

## 2012-04-02 NOTE — Telephone Encounter (Signed)
Patient was in today to have lab work done, while here she stated that she would like to speak with Earlyne Iba regarding her cholesterol.  I asked was their anything specific she wanted to discuss and she didn't say.  She stated that her most recent total cholesterol was 201 and she is currently taking Zetia.

## 2012-04-03 NOTE — Telephone Encounter (Signed)
I spoke with patient, she stated she just wanted to let Dr. Derrel Nip know the reason her cholesterol was borderline was because she was off of her medication for a few weeks but she is back on it now.  She stated the lab tech told her the results.

## 2012-04-03 NOTE — Telephone Encounter (Signed)
Left message asking patient to call back

## 2012-04-16 ENCOUNTER — Telehealth: Payer: Self-pay | Admitting: Internal Medicine

## 2012-04-16 MED ORDER — WARFARIN SODIUM 4 MG PO TABS: 4.0000 mg | ORAL_TABLET | ORAL | Status: DC

## 2012-04-16 NOTE — Telephone Encounter (Signed)
Warfarin sodium 4 mg tab #30 Take one 6 mg on 5 days and 2 mg on 2 days

## 2012-04-17 ENCOUNTER — Other Ambulatory Visit (INDEPENDENT_AMBULATORY_CARE_PROVIDER_SITE_OTHER): Payer: Medicare Other

## 2012-04-17 DIAGNOSIS — Z7901 Long term (current) use of anticoagulants: Secondary | ICD-10-CM

## 2012-04-18 ENCOUNTER — Other Ambulatory Visit: Payer: Medicare Other

## 2012-05-14 ENCOUNTER — Other Ambulatory Visit: Payer: Self-pay | Admitting: *Deleted

## 2012-05-14 MED ORDER — WARFARIN SODIUM 4 MG PO TABS: 4.0000 mg | ORAL_TABLET | ORAL | Status: DC

## 2012-05-14 NOTE — Telephone Encounter (Signed)
R'cd fax from [facility] for refill of Warfarin.

## 2012-05-21 ENCOUNTER — Other Ambulatory Visit (INDEPENDENT_AMBULATORY_CARE_PROVIDER_SITE_OTHER): Payer: Medicare Other

## 2012-05-21 DIAGNOSIS — Z7901 Long term (current) use of anticoagulants: Secondary | ICD-10-CM

## 2012-08-20 ENCOUNTER — Telehealth: Payer: Self-pay | Admitting: *Deleted

## 2012-08-20 MED ORDER — EZETIMIBE 10 MG PO TABS: 10.0000 mg | ORAL_TABLET | Freq: Every day | ORAL | Status: AC

## 2012-08-20 NOTE — Telephone Encounter (Signed)
Rx request:  Zetia 10 mg  #30  Take 1 tablet each day

## 2012-08-20 NOTE — Telephone Encounter (Signed)
Med filled.  

## 2012-10-15 ENCOUNTER — Ambulatory Visit: Payer: Self-pay | Admitting: Internal Medicine

## 2012-12-19 ENCOUNTER — Other Ambulatory Visit: Payer: Self-pay | Admitting: *Deleted

## 2012-12-21 MED ORDER — WARFARIN SODIUM 4 MG PO TABS: 4.0000 mg | ORAL_TABLET | ORAL | Status: AC

## 2013-01-16 ENCOUNTER — Other Ambulatory Visit: Payer: Self-pay | Admitting: *Deleted

## 2013-01-17 NOTE — Telephone Encounter (Signed)
This patient needs to be called.  She cannot take coumadin without regular followupon her INR.,  refll for 30 days and have her come ASAP for PT/INR

## 2013-01-17 NOTE — Telephone Encounter (Signed)
Last PT/INR was 10/13. Last OV 8/13, refill?

## 2013-01-18 NOTE — Telephone Encounter (Signed)
Spoke with pt, states she has changed her PCP to [facility] and will no longer be seeing Dr. Derrel Nip. Advised to contact pharmacy and have Rx refill sent to new physician. Refill denied.

## 2013-10-16 ENCOUNTER — Ambulatory Visit: Payer: Self-pay | Admitting: Internal Medicine

## 2013-11-29 DIAGNOSIS — E559 Vitamin D deficiency, unspecified: Secondary | ICD-10-CM | POA: Insufficient documentation

## 2013-11-29 DIAGNOSIS — I359 Nonrheumatic aortic valve disorder, unspecified: Secondary | ICD-10-CM | POA: Insufficient documentation

## 2013-11-29 DIAGNOSIS — N189 Chronic kidney disease, unspecified: Secondary | ICD-10-CM | POA: Insufficient documentation

## 2014-03-18 DIAGNOSIS — M549 Dorsalgia, unspecified: Secondary | ICD-10-CM | POA: Insufficient documentation

## 2014-10-21 ENCOUNTER — Ambulatory Visit: Payer: Self-pay | Admitting: Internal Medicine

## 2015-10-08 ENCOUNTER — Other Ambulatory Visit: Payer: Self-pay | Admitting: Internal Medicine

## 2015-10-08 DIAGNOSIS — I7789 Other specified disorders of arteries and arterioles: Secondary | ICD-10-CM

## 2015-10-22 ENCOUNTER — Ambulatory Visit: Admission: RE | Admit: 2015-10-22 | Payer: Medicare Other | Source: Ambulatory Visit

## 2016-01-19 DIAGNOSIS — I712 Thoracic aortic aneurysm, without rupture, unspecified: Secondary | ICD-10-CM | POA: Insufficient documentation

## 2016-09-27 DIAGNOSIS — N184 Chronic kidney disease, stage 4 (severe): Secondary | ICD-10-CM | POA: Insufficient documentation

## 2016-09-27 DIAGNOSIS — M545 Low back pain, unspecified: Secondary | ICD-10-CM | POA: Insufficient documentation

## 2016-09-27 DIAGNOSIS — E1122 Type 2 diabetes mellitus with diabetic chronic kidney disease: Secondary | ICD-10-CM | POA: Insufficient documentation

## 2016-09-27 DIAGNOSIS — E119 Type 2 diabetes mellitus without complications: Secondary | ICD-10-CM | POA: Insufficient documentation

## 2017-08-17 ENCOUNTER — Emergency Department
Admission: EM | Admit: 2017-08-17 | Discharge: 2017-08-17 | Disposition: A | Discharge status: Home / Self Care | Payer: Medicare Other | Attending: Emergency Medicine | Admitting: Emergency Medicine

## 2017-08-17 ENCOUNTER — Emergency Department: Payer: Medicare Other

## 2017-08-17 ENCOUNTER — Other Ambulatory Visit: Payer: Self-pay

## 2017-08-17 DIAGNOSIS — Z950 Presence of cardiac pacemaker: Secondary | ICD-10-CM | POA: Diagnosis not present

## 2017-08-17 DIAGNOSIS — Z79899 Other long term (current) drug therapy: Secondary | ICD-10-CM | POA: Diagnosis not present

## 2017-08-17 DIAGNOSIS — R040 Epistaxis: Secondary | ICD-10-CM | POA: Diagnosis not present

## 2017-08-17 DIAGNOSIS — I129 Hypertensive chronic kidney disease with stage 1 through stage 4 chronic kidney disease, or unspecified chronic kidney disease: Secondary | ICD-10-CM | POA: Insufficient documentation

## 2017-08-17 DIAGNOSIS — Z7901 Long term (current) use of anticoagulants: Secondary | ICD-10-CM | POA: Diagnosis not present

## 2017-08-17 DIAGNOSIS — N189 Chronic kidney disease, unspecified: Secondary | ICD-10-CM | POA: Diagnosis not present

## 2017-08-17 DIAGNOSIS — Z952 Presence of prosthetic heart valve: Secondary | ICD-10-CM | POA: Insufficient documentation

## 2017-08-17 LAB — COMPREHENSIVE METABOLIC PANEL
ALT: 14 U/L (ref 14–54)
AST: 28 U/L (ref 15–41)
Albumin: 4 g/dL (ref 3.5–5.0)
Alkaline Phosphatase: 77 U/L (ref 38–126)
Anion gap: 7 (ref 5–15)
BUN: 37 mg/dL — ABNORMAL HIGH (ref 6–20)
CO2: 22 mmol/L (ref 22–32)
Calcium: 8.6 mg/dL — ABNORMAL LOW (ref 8.9–10.3)
Chloride: 110 mmol/L (ref 101–111)
Creatinine, Ser: 2.18 mg/dL — ABNORMAL HIGH (ref 0.44–1.00)
GFR calc Af Amer: 23 mL/min — ABNORMAL LOW (ref >60)
GFR calc non Af Amer: 20 mL/min — ABNORMAL LOW (ref >60)
Glucose, Bld: 136 mg/dL — ABNORMAL HIGH (ref 65–99)
Potassium: 4.8 mmol/L (ref 3.5–5.1)
Sodium: 139 mmol/L (ref 135–145)
Total Bilirubin: 0.7 mg/dL (ref 0.3–1.2)
Total Protein: 7.2 g/dL (ref 6.5–8.1)

## 2017-08-17 LAB — PROTIME-INR
INR: 1.71
Prothrombin Time: 19.9 seconds — ABNORMAL HIGH (ref 11.4–15.2)

## 2017-08-17 LAB — CBC WITH DIFFERENTIAL/PLATELET
Basophils Absolute: 0 10*3/uL (ref 0–0.1)
Basophils Relative: 1 %
Eosinophils Absolute: 0.2 10*3/uL (ref 0–0.7)
Eosinophils Relative: 4 %
HCT: 29 % — ABNORMAL LOW (ref 35.0–47.0)
Hemoglobin: 9.6 g/dL — ABNORMAL LOW (ref 12.0–16.0)
Lymphocytes Relative: 14 %
Lymphs Abs: 0.9 10*3/uL — ABNORMAL LOW (ref 1.0–3.6)
MCH: 31.1 pg (ref 26.0–34.0)
MCHC: 33 g/dL (ref 32.0–36.0)
MCV: 94 fL (ref 80.0–100.0)
Monocytes Absolute: 0.8 10*3/uL (ref 0.2–0.9)
Monocytes Relative: 13 %
Neutro Abs: 4.2 10*3/uL (ref 1.4–6.5)
Neutrophils Relative %: 68 %
Platelets: 113 10*3/uL — ABNORMAL LOW (ref 150–440)
RBC: 3.08 MIL/uL — ABNORMAL LOW (ref 3.80–5.20)
RDW: 15.6 % — ABNORMAL HIGH (ref 11.5–14.5)
WBC: 6.1 10*3/uL (ref 3.6–11.0)

## 2017-08-17 MED ORDER — OXYMETAZOLINE HCL 0.05 % NA SOLN
1.0000 | Freq: Once | NASAL | Status: AC
  Administered 2017-08-17: 1 via NASAL
  Filled 2017-08-17: qty 15

## 2017-08-17 NOTE — ED Triage Notes (Signed)
Pt arrives from [facility] with c/o nosebleed. No bleeding in triage. Pt was started on antibiotics, ASA, mucinex yesterday for a cold. Pt states she spat out small amount of blood with nosebleed. Pt states she takes coumadin. No distress noted.

## 2017-08-17 NOTE — ED Notes (Signed)
Patient transported to X-ray 

## 2017-08-17 NOTE — ED Provider Notes (Signed)
[facility] Emergency Department Provider Note  ____________________________________________  Time seen: Approximately 8:17 PM  I have reviewed the triage vital signs and the nursing notes.   HISTORY  Chief Complaint Epistaxis    HPI Katie Mcintosh is a 84 y.o. female presents to the emergency department with acute onset epistaxis.  Patient reports that she has experienced cough for approximately 1 week.  Patient reports seeing blood tinged sputum today shortly before epistaxis started.  Epistaxis started at approximately 5:00 PM.  While epistaxis has improved, it is not completely resolved prior to presenting to the emergency department.  Patient is currently taking Coumadin.  She was recently started on amoxicillin by primary care provider.  Patient denies the use of nasal sprays or recreational drugs.  Past Medical History:  Diagnosis Date  . Anemia   . Chronic kidney disease   . GERD (gastroesophageal reflux disease)   . Hyperlipidemia   . Hypertension   . Warfarin-induced coagulopathy (HCC)    goal inr 2.5 to 3.5    Patient Active Problem List   Diagnosis Date Noted  . Screening for colon cancer 03/14/2012  . Hyperkalemia 09/12/2011  . Chronic kidney disease   . Anemia   . Hyperlipidemia   . GERD (gastroesophageal reflux disease)   . Hypertension     Past Surgical History:  Procedure Laterality Date  . AORTIC VALVE REPLACEMENT    . PACEMAKER INSERTION      Prior to Admission medications   Medication Sig Start Date End Date Taking? Authorizing Provider  carvedilol (COREG) 12.5 MG tablet Take 1 tablet (12.5 mg total) by mouth 2 (two) times daily with a meal. 01/24/12 01/23/13  Crecencio Mc, MD  ezetimibe (ZETIA) 10 MG tablet Take 1 tablet (10 mg total) by mouth daily. 08/20/12 08/20/13  Crecencio Mc, MD  omeprazole (PRILOSEC) 20 MG capsule Take 1 capsule (20 mg total) by mouth daily. 09/15/11   Crecencio Mc, MD  sodium bicarbonate 650 MG  tablet Take 650 mg by mouth 2 (two) times daily.    [provider]  warfarin (COUMADIN) 4 MG tablet Take 1 tablet (4 mg total) by mouth as directed. 6mg  on Sunday and Wednesday 12/19/12   Crecencio Mc, MD    Allergies Patient has no known allergies.  History reviewed. No pertinent family history.  Social History Social History   Tobacco Use  . Smoking status: Never Smoker  . Smokeless tobacco: Never Used  Substance Use Topics  . Alcohol use: No  . Drug use: No     Review of Systems  Constitutional: No fever/chills Eyes: No visual changes. No discharge ENT: Patient has epistaxis.  Cardiovascular: no chest pain. Respiratory: Patient has cough.  Musculoskeletal: Negative for musculoskeletal pain. Skin: Negative for rash, abrasions, lacerations, ecchymosis. Neurological: Negative for headaches, focal weakness or numbness.   ____________________________________________   PHYSICAL EXAM:  VITAL SIGNS: ED Triage Vitals [08/17/17 1843]  Enc Vitals Group     BP 127/71     Pulse Rate 70     Resp 16     Temp 98.2 F (36.8 C)     Temp Source Oral     SpO2 99 %     Weight 147 lb (66.7 kg)     Height 5\' 6"  (1.676 m)     Head Circumference      Peak Flow      Pain Score 0     Pain Loc  Pain Edu?      Excl. in [place]?      Constitutional: Alert and oriented. Well appearing and in no acute distress. Eyes: Conjunctivae are normal. PERRL. EOMI. Head: Atraumatic. ENT:      Ears: TMs are pearly bilaterally.      Nose: Patient has evidence of blood in the right nare.  Blood clot formation visualized.  No evidence of blood in the left nare or posterior pharynx.      Mouth/Throat: Mucous membranes are moist.  Neck: No stridor. No cervical spine tenderness to palpation.  Cardiovascular: Normal rate, regular rhythm. Normal S1 and S2.  Good peripheral circulation. Respiratory: Normal respiratory effort without tachypnea or retractions. Lungs CTAB. Good air entry to  the bases with no decreased or absent breath sounds. Musculoskeletal: Full range of motion to all extremities. No gross deformities appreciated. Neurologic:  Normal speech and language. No gross focal neurologic deficits are appreciated.  Skin:  Skin is warm, dry and intact. No rash noted. Psychiatric: Mood and affect are normal. Speech and behavior are normal. Patient exhibits appropriate insight and judgement.   ____________________________________________   LABS (all labs ordered are listed, but only abnormal results are displayed)  Labs Reviewed  CBC WITH DIFFERENTIAL/PLATELET - Abnormal; Notable for the following components:      Result Value   RBC 3.08 (*)    Hemoglobin 9.6 (*)    HCT 29.0 (*)    RDW 15.6 (*)    Platelets 113 (*)    Lymphs Abs 0.9 (*)    All other components within normal limits  COMPREHENSIVE METABOLIC PANEL - Abnormal; Notable for the following components:   Glucose, Bld 136 (*)    BUN 37 (*)    Creatinine, Ser 2.18 (*)    Calcium 8.6 (*)    GFR calc non Af Amer 20 (*)    GFR calc Af Amer 23 (*)    All other components within normal limits  PROTIME-INR - Abnormal; Notable for the following components:   Prothrombin Time 19.9 (*)    All other components within normal limits   ____________________________________________  EKG   ____________________________________________  RADIOLOGY Unk Pinto, personally viewed and evaluated these images (plain radiographs) as part of my medical decision making, as well as reviewing the written report by the radiologist.    Dg Chest 2 View  Result Date: 08/17/2017 CLINICAL DATA:  84 year old female with cough productive of blood-tinged sputum. EXAM: CHEST  2 VIEW COMPARISON:  05/15/2009. Report of [place] [facility] chest radiographs 09/26/2015 (no images available). FINDINGS: Chronic fusiform enlargement and tortuosity of the thoracic aorta with a stable radiographic appearance since 2010. Superimposed mild  to moderate cardiomegaly. Stable mediastinal contours. Previous cardiac valve replacement. Chronic right chest dual lead cardiac pacemaker. Stable lung volumes. The lungs are clear. No pneumothorax or pleural effusion. Abdomen Calcified aortic atherosclerosis. Negative visible bowel gas pattern. No acute osseous abnormality identified. IMPRESSION: 1. Stable since 2010 radiographic contour of fusiform enlargement and tortuosity of the thoracic aorta. 2.  No acute cardiopulmonary abnormality. Electronically Signed   By: Genevie Ann M.D.   On: 08/17/2017 20:41    ____________________________________________    PROCEDURES  Procedure(s) performed:    Procedures    Medications  oxymetazoline (AFRIN) 0.05 % nasal spray 1 spray (1 spray Each Nare Given 08/17/17 2021)     ____________________________________________   INITIAL IMPRESSION / ASSESSMENT AND PLAN / ED COURSE  Pertinent labs & imaging results that were available during my  care of the patient were reviewed by me and considered in my medical decision making (see chart for details).  Review of the [place] CSRS was performed in accordance of the [place] prior to dispensing any controlled drugs.     Assessment and Plan:  Epistaxis Patient presents to the emergency department with epistaxis since approximately 5:00 PM.  CBC, CMP and pro time INR were consistent with prior blood work conducted in the past.  Afrin was given in the emergency department.  Return precautions were given.  Patient was advised to follow-up with primary care as needed.  All patient questions were answered.     ____________________________________________  FINAL CLINICAL IMPRESSION(S) / ED DIAGNOSES  Final diagnoses:  Epistaxis      NEW MEDICATIONS STARTED DURING THIS VISIT:  ED Discharge Orders    None          This chart was dictated using voice recognition software/Dragon. Despite best efforts to proofread, errors can occur which can change the  meaning. Any change was purely unintentional.    Lannie Fields, PA-C 08/17/17 2121    Harvest Dark, MD 08/17/17 2216

## 2017-08-17 NOTE — ED Notes (Signed)
ED Provider at bedside. 

## 2018-01-29 ENCOUNTER — Other Ambulatory Visit: Payer: Self-pay | Admitting: Nephrology

## 2018-01-29 DIAGNOSIS — R769 Abnormal immunological finding in serum, unspecified: Secondary | ICD-10-CM

## 2018-02-02 ENCOUNTER — Ambulatory Visit: Payer: Medicare Other

## 2018-12-13 DIAGNOSIS — H5203 Hypermetropia, bilateral: Secondary | ICD-10-CM | POA: Insufficient documentation

## 2018-12-13 DIAGNOSIS — H52223 Regular astigmatism, bilateral: Secondary | ICD-10-CM | POA: Insufficient documentation

## 2018-12-13 DIAGNOSIS — H25813 Combined forms of age-related cataract, bilateral: Secondary | ICD-10-CM | POA: Insufficient documentation

## 2018-12-13 DIAGNOSIS — H524 Presbyopia: Secondary | ICD-10-CM | POA: Insufficient documentation

## 2018-12-13 DIAGNOSIS — H401230 Low-tension glaucoma, bilateral, stage unspecified: Secondary | ICD-10-CM | POA: Insufficient documentation

## 2019-05-08 DIAGNOSIS — R609 Edema, unspecified: Secondary | ICD-10-CM | POA: Insufficient documentation

## 2019-05-08 DIAGNOSIS — R600 Localized edema: Secondary | ICD-10-CM | POA: Insufficient documentation

## 2019-05-08 DIAGNOSIS — D631 Anemia in chronic kidney disease: Secondary | ICD-10-CM | POA: Insufficient documentation

## 2019-05-08 DIAGNOSIS — D472 Monoclonal gammopathy: Secondary | ICD-10-CM | POA: Insufficient documentation

## 2019-05-08 DIAGNOSIS — N2581 Secondary hyperparathyroidism of renal origin: Secondary | ICD-10-CM | POA: Insufficient documentation

## 2019-05-08 DIAGNOSIS — N189 Chronic kidney disease, unspecified: Secondary | ICD-10-CM | POA: Insufficient documentation

## 2019-10-16 ENCOUNTER — Other Ambulatory Visit: Payer: Self-pay | Admitting: Internal Medicine

## 2019-10-16 ENCOUNTER — Ambulatory Visit
Admission: RE | Admit: 2019-10-16 | Discharge: 2019-10-16 | Disposition: A | Discharge status: Home / Self Care | Payer: Medicare Other | Source: Ambulatory Visit | Attending: Internal Medicine | Admitting: Internal Medicine

## 2019-10-16 ENCOUNTER — Other Ambulatory Visit: Payer: Self-pay

## 2019-10-16 DIAGNOSIS — I10 Essential (primary) hypertension: Secondary | ICD-10-CM | POA: Diagnosis present

## 2019-10-16 DIAGNOSIS — T798XXA Other early complications of trauma, initial encounter: Secondary | ICD-10-CM | POA: Diagnosis present

## 2019-10-16 DIAGNOSIS — S0093XA Contusion of unspecified part of head, initial encounter: Secondary | ICD-10-CM | POA: Diagnosis present

## 2019-10-16 DIAGNOSIS — Z9181 History of falling: Secondary | ICD-10-CM | POA: Diagnosis present

## 2020-02-18 DIAGNOSIS — S065XAA Traumatic subdural hemorrhage with loss of consciousness status unknown, initial encounter: Secondary | ICD-10-CM | POA: Insufficient documentation

## 2020-02-18 DIAGNOSIS — I619 Nontraumatic intracerebral hemorrhage, unspecified: Secondary | ICD-10-CM | POA: Insufficient documentation

## 2020-02-18 DIAGNOSIS — S065X9A Traumatic subdural hemorrhage with loss of consciousness of unspecified duration, initial encounter: Secondary | ICD-10-CM | POA: Insufficient documentation

## 2020-04-06 DIAGNOSIS — Z7901 Long term (current) use of anticoagulants: Secondary | ICD-10-CM | POA: Insufficient documentation

## 2020-05-12 ENCOUNTER — Other Ambulatory Visit (HOSPITAL_COMMUNITY): Payer: Self-pay | Admitting: Internal Medicine

## 2020-05-12 ENCOUNTER — Other Ambulatory Visit: Payer: Self-pay | Admitting: Internal Medicine

## 2020-05-12 DIAGNOSIS — M79601 Pain in right arm: Secondary | ICD-10-CM

## 2020-05-12 DIAGNOSIS — Z950 Presence of cardiac pacemaker: Secondary | ICD-10-CM

## 2020-05-12 DIAGNOSIS — I359 Nonrheumatic aortic valve disorder, unspecified: Secondary | ICD-10-CM

## 2020-05-13 ENCOUNTER — Ambulatory Visit
Admission: RE | Admit: 2020-05-13 | Discharge: 2020-05-13 | Disposition: A | Discharge status: Home / Self Care | Payer: Medicare Other | Source: Ambulatory Visit | Attending: Internal Medicine | Admitting: Internal Medicine

## 2020-05-13 ENCOUNTER — Other Ambulatory Visit: Payer: Self-pay

## 2020-05-13 DIAGNOSIS — M79601 Pain in right arm: Secondary | ICD-10-CM | POA: Diagnosis present

## 2020-05-13 DIAGNOSIS — Z950 Presence of cardiac pacemaker: Secondary | ICD-10-CM | POA: Insufficient documentation

## 2020-05-13 DIAGNOSIS — I359 Nonrheumatic aortic valve disorder, unspecified: Secondary | ICD-10-CM | POA: Diagnosis not present

## 2020-05-19 ENCOUNTER — Other Ambulatory Visit: Payer: Self-pay

## 2020-05-19 ENCOUNTER — Ambulatory Visit (INDEPENDENT_AMBULATORY_CARE_PROVIDER_SITE_OTHER): Payer: Medicare Other | Admitting: Vascular Surgery

## 2020-05-19 ENCOUNTER — Encounter (INDEPENDENT_AMBULATORY_CARE_PROVIDER_SITE_OTHER): Payer: Self-pay | Admitting: Vascular Surgery

## 2020-05-19 VITALS — BP 151/77 | HR 85 | Ht 66.0 in | Wt 130.0 lb

## 2020-05-19 DIAGNOSIS — N184 Chronic kidney disease, stage 4 (severe): Secondary | ICD-10-CM

## 2020-05-19 DIAGNOSIS — I10 Essential (primary) hypertension: Secondary | ICD-10-CM

## 2020-05-19 DIAGNOSIS — E1122 Type 2 diabetes mellitus with diabetic chronic kidney disease: Secondary | ICD-10-CM | POA: Diagnosis not present

## 2020-05-19 DIAGNOSIS — M7989 Other specified soft tissue disorders: Secondary | ICD-10-CM

## 2020-05-19 NOTE — Patient Instructions (Signed)

## 2020-05-19 NOTE — Progress Notes (Signed)
Patient ID: Katie Mcintosh, female   DOB: March 27, 1934, 86 y.o.   MRN: [id number]  Chief Complaint  Patient presents with  . New Patient (Initial Visit)    Meloy. RUE edema    HPI JENET Mcintosh is a 86 y.o. female.  I am asked to see the patient by A. Meloy, PA-C for evaluation of right arm swelling. The patient has noticed fairly severe swelling in the right arm for about 3 months now. This has been an on and off problem and a much more mild form previously. She has had multiple IVs in that arm when she was sick with other issues earlier this year. She did have a DVT study done at the hospital on the order of her orthopedic office last week which showed no DVT. She has also had some major injury to that arm with what appears to be a right radial artery repair after cardiac catheterization and subsequent pacemaker placement almost 30 years ago. She still has pain in her right thumb and wrist area. No open wounds or infection. The swelling is uncomfortable. The daughter has tried to elevate her arm but it is difficult to keep it that way.   Past Medical History:  Diagnosis Date  . Anemia   . Chronic kidney disease   . GERD (gastroesophageal reflux disease)   . Hyperlipidemia   . Hypertension   . Warfarin-induced coagulopathy (HCC)    goal inr 2.5 to 3.5    Past Surgical History:  Procedure Laterality Date  . AORTIC VALVE REPLACEMENT    . PACEMAKER INSERTION       Family history No bleeding disorders, clotting disorders, aneurysms, autoimmune diseases, or porphyria   Social History   Tobacco Use  . Smoking status: Never Smoker  . Smokeless tobacco: Never Used  Substance Use Topics  . Alcohol use: No  . Drug use: No     Allergies  Allergen Reactions  . Atorvastatin Other (See Comments)    unknown unknown unknown unknown   . Enalapril Other (See Comments)  . Rosuvastatin Rash    Current Outpatient Medications  Medication Sig Dispense Refill  . amLODipine  (NORVASC) 10 MG tablet Take by mouth.    . brimonidine (ALPHAGAN) 0.2 % ophthalmic solution PLACE 1 DROP INTO BOTH EYES TWICE A DAY (1ST DROP UPON WAKING AND SECOND DROP 8 HOURS LATER)    . Cholecalciferol 25 MCG (1000 UT) tablet Take by mouth.    . cyanocobalamin 1000 MCG tablet Take by mouth.    . ferrous gluconate (FERGON) 324 MG tablet Take by mouth.    . furosemide (LASIX) 40 MG tablet Take by mouth.    . latanoprost (XALATAN) 0.005 % ophthalmic solution Place 1 drop into both eyes at bedtime.    Marland Kitchen omeprazole (PRILOSEC) 20 MG capsule Take 1 capsule (20 mg total) by mouth daily. 30 capsule 3  . sodium bicarbonate 650 MG tablet Take 650 mg by mouth 2 (two) times daily.    . traMADol (ULTRAM) 50 MG tablet Take by mouth.    . warfarin (COUMADIN) 4 MG tablet Take 1 tablet (4 mg total) by mouth as directed. 6mg  on Sunday and Wednesday 34 tablet 3  . carvedilol (COREG) 12.5 MG tablet Take 1 tablet (12.5 mg total) by mouth 2 (two) times daily with a meal. 60 tablet 3  . ezetimibe (ZETIA) 10 MG tablet Take 1 tablet (10 mg total) by mouth daily. 30 tablet 3   No current  facility-administered medications for this visit.      REVIEW OF SYSTEMS (Negative unless checked)  Constitutional: [] Weight loss  [] Fever  [] Chills Cardiac: [] Chest pain   [] Chest pressure   [] Palpitations   [] Shortness of breath when laying flat   [] Shortness of breath at rest   [] Shortness of breath with exertion. Vascular:  [] Pain in legs with walking   [] Pain in legs at rest   [] Pain in legs when laying flat   [] Claudication   [] Pain in feet when walking  [] Pain in feet at rest  [] Pain in feet when laying flat   [] History of DVT   [] Phlebitis   [] Swelling in legs   [] Varicose veins   [] Non-healing ulcers Pulmonary:   [] Uses home oxygen   [] Productive cough   [] Hemoptysis   [] Wheeze  [] COPD   [] Asthma Neurologic:  [] Dizziness  [] Blackouts   [] Seizures   [] History of stroke   [] History of TIA  [] Aphasia   [] Temporary blindness    [] Dysphagia   [] Weakness or numbness in arms   [] Weakness or numbness in legs Musculoskeletal:  [x] Arthritis   [] Joint swelling   [] Joint pain   [] Low back pain Hematologic:  [x] Easy bruising  [x] Easy bleeding   [] Hypercoagulable state   [x] Anemic  [] Hepatitis Gastrointestinal:  [] Blood in stool   [] Vomiting blood  [x] Gastroesophageal reflux/heartburn   [] Abdominal pain Genitourinary:  [] Chronic kidney disease   [] Difficult urination  [] Frequent urination  [] Burning with urination   [] Hematuria Skin:  [] Rashes   [] Ulcers   [] Wounds Psychological:  [] History of anxiety   []  History of major depression.    Physical Exam BP (!) 151/77   Pulse 85   Ht 5\' 6"  (1.676 m)   Wt 130 lb (59 kg)   BMI 20.98 kg/m  Gen:  WD/WN, NAD Head: [place]/AT, No temporalis wasting.  Ear/Nose/Throat: Hearing grossly intact, nares w/o erythema or drainage, oropharynx w/o Erythema/Exudate Eyes: Conjunctiva clear, sclera non-icteric  Neck: trachea midline.  No JVD.  Pulmonary:  Good air movement, respirations not labored, no use of accessory muscles  Cardiac: Somewhat irregular Vascular:  Vessel Right Left  Radial  not palpable palpable  Ulnar  palpable  palpable                                Musculoskeletal: M/S 5/5 throughout.  Extremities without ischemic changes.  No deformity or atrophy. 2+ right upper extremity edema. Neurologic: Sensation grossly intact in extremities.  Symmetrical.  Speech is fluent. Motor exam as listed above. Psychiatric: Judgment intact, Mood & affect appropriate for pt's clinical situation. Dermatologic: No rashes or ulcers noted.  No cellulitis or open wounds.    Radiology US Venous Img Upper Uni Right(DVT)  Result Date: 05/13/2020 CLINICAL DATA:  Right arm pain and swelling EXAM: RIGHT UPPER EXTREMITY VENOUS DOPPLER ULTRASOUND TECHNIQUE: Gray-scale sonography with graded compression, as well as color Doppler and duplex ultrasound were performed to evaluate the upper  extremity deep venous system from the level of the subclavian vein and including the jugular, axillary, basilic, radial, ulnar and upper cephalic vein. Spectral Doppler was utilized to evaluate flow at rest and with distal augmentation maneuvers. COMPARISON:  None. FINDINGS: Contralateral Subclavian Vein: Respiratory phasicity is normal and symmetric with the symptomatic side. No evidence of thrombus. Normal compressibility. Internal Jugular Vein: No evidence of thrombus. Normal compressibility, respiratory phasicity and response to augmentation. Subclavian Vein: No evidence of thrombus. Normal compressibility, respiratory phasicity and  response to augmentation. Axillary Vein: No evidence of thrombus. Normal compressibility, respiratory phasicity and response to augmentation. Cephalic Vein: No evidence of thrombus. Normal compressibility, respiratory phasicity and response to augmentation. Basilic Vein: No evidence of thrombus. Normal compressibility, respiratory phasicity and response to augmentation. Brachial Veins: No evidence of thrombus. Normal compressibility, respiratory phasicity and response to augmentation. Radial Veins: No evidence of thrombus. Normal compressibility, respiratory phasicity and response to augmentation. Ulnar Veins: No evidence of thrombus. Normal compressibility, respiratory phasicity and response to augmentation. IMPRESSION: No evidence of DVT within the right upper extremity. Electronically Signed   By: Jerilynn Mages.  Shick M.D.   On: 05/13/2020 16:37    Labs No results found for this or any previous visit (from the past 2160 hour(s)).  Assessment/Plan:  Swelling of arm Patient has pronounced swelling of her right upper extremity. She has had a negative DVT study done earlier this month at the hospital. She has had previous surgery on that arm and it would appear as if she has severe lymphedema at this point. I would say at least stage II lymphedema as a been doing compression wraps on  her and her swelling persists. At this point, I would like to get her a formal compression sleeve for her right arm. I would recommend a lymphedema pump as an adjuvant therapy to try to improve her pain and swelling. We will plan to see her back in 2 to 3 months in follow-up to assess her response to therapy.  Type 2 diabetes mellitus with stage 4 chronic kidney disease, without long-term current use of insulin (HCC) blood glucose control important in reducing the progression of atherosclerotic disease. Also, involved in wound healing. On appropriate medications. Chronic kidney disease could worsen her fluid retention as well.   Essential hypertension blood pressure control important in reducing the progression of atherosclerotic disease. On appropriate oral medications.       Leotis Pain 05/19/2020, 2:16 PM   This note was created with Dragon medical transcription system.  Any errors from dictation are unintentional.

## 2020-05-19 NOTE — Assessment & Plan Note (Signed)
blood pressure control important in reducing the progression of atherosclerotic disease. On appropriate oral medications.  

## 2020-05-19 NOTE — Assessment & Plan Note (Signed)
blood glucose control important in reducing the progression of atherosclerotic disease. Also, involved in wound healing. On appropriate medications. Chronic kidney disease could worsen her fluid retention as well.

## 2020-05-19 NOTE — Assessment & Plan Note (Signed)
Patient has pronounced swelling of her right upper extremity. She has had a negative DVT study done earlier this month at the hospital. She has had previous surgery on that arm and it would appear as if she has severe lymphedema at this point. I would say at least stage II lymphedema as a been doing compression wraps on her and her swelling persists. At this point, I would like to get her a formal compression sleeve for her right arm. I would recommend a lymphedema pump as an adjuvant therapy to try to improve her pain and swelling. We will plan to see her back in 2 to 3 months in follow-up to assess her response to therapy.

## 2020-05-20 ENCOUNTER — Other Ambulatory Visit (HOSPITAL_COMMUNITY): Payer: Self-pay | Admitting: Internal Medicine

## 2020-05-20 ENCOUNTER — Other Ambulatory Visit: Payer: Self-pay | Admitting: Internal Medicine

## 2020-05-20 DIAGNOSIS — M79601 Pain in right arm: Secondary | ICD-10-CM

## 2020-08-18 ENCOUNTER — Other Ambulatory Visit: Payer: Self-pay

## 2020-08-18 ENCOUNTER — Ambulatory Visit (INDEPENDENT_AMBULATORY_CARE_PROVIDER_SITE_OTHER): Payer: Medicare Other | Admitting: Vascular Surgery

## 2020-08-18 VITALS — BP 121/57 | HR 65 | Resp 16 | Wt 130.6 lb

## 2020-08-18 DIAGNOSIS — E1122 Type 2 diabetes mellitus with diabetic chronic kidney disease: Secondary | ICD-10-CM | POA: Diagnosis not present

## 2020-08-18 DIAGNOSIS — R600 Localized edema: Secondary | ICD-10-CM

## 2020-08-18 DIAGNOSIS — I890 Lymphedema, not elsewhere classified: Secondary | ICD-10-CM | POA: Diagnosis not present

## 2020-08-18 DIAGNOSIS — N184 Chronic kidney disease, stage 4 (severe): Secondary | ICD-10-CM

## 2020-08-18 DIAGNOSIS — I10 Essential (primary) hypertension: Secondary | ICD-10-CM

## 2020-08-18 DIAGNOSIS — M7989 Other specified soft tissue disorders: Secondary | ICD-10-CM

## 2020-08-18 NOTE — Assessment & Plan Note (Signed)
Her arm swelling is actually better even without the sleeves.  She is doing reasonably well.  We discussed avoiding blood pressure and blood draws on that right side.  Elevate the arm as tolerated.  Contact our office if symptoms worsen.

## 2020-08-18 NOTE — Progress Notes (Signed)
MRN : [id number]  Katie Mcintosh is a 87 y.o. (11/07/1933) female who presents with chief complaint of  Chief Complaint  Patient presents with  . Follow-up    3 month follow up  .  History of Present Illness: Patient returns today in follow up of her right arm lymphedema.  It has actually gotten significantly better with just avoiding blood pressure, blood draws, and other irritants to the right arm.  She is still elevating a lot.  She did not get her lymphedema sleeve as the medical supply store would not work with her insurance.  No new ulcers or infection.  Current Outpatient Medications  Medication Sig Dispense Refill  . amLODipine (NORVASC) 10 MG tablet Take by mouth.    . brimonidine (ALPHAGAN) 0.2 % ophthalmic solution PLACE 1 DROP INTO BOTH EYES TWICE A DAY (1ST DROP UPON WAKING AND SECOND DROP 8 HOURS LATER)    . Cholecalciferol 25 MCG (1000 UT) tablet Take by mouth.    . cyanocobalamin 1000 MCG tablet Take by mouth.    . ferrous gluconate (FERGON) 324 MG tablet Take by mouth.    . furosemide (LASIX) 40 MG tablet Take by mouth as needed.    . hydrALAZINE (APRESOLINE) 25 MG tablet 1 tablet 2 (two) times daily.    Marland Kitchen latanoprost (XALATAN) 0.005 % ophthalmic solution Place 1 drop into both eyes at bedtime.    . Olopatadine HCl (PATADAY OP) Apply to eye daily.    Marland Kitchen omeprazole (PRILOSEC) 20 MG capsule Take 1 capsule (20 mg total) by mouth daily. (Patient taking differently: Take 20 mg by mouth as needed.) 30 capsule 3  . sodium bicarbonate 650 MG tablet Take 650 mg by mouth 2 (two) times daily.    . traMADol (ULTRAM) 50 MG tablet Take by mouth.    . warfarin (COUMADIN) 4 MG tablet Take 1 tablet (4 mg total) by mouth as directed. 6mg  on Sunday and Wednesday (Patient taking differently: Take 5 mg by mouth as directed. 5 mg Mon-Friday,1 mg Sat-Sunday) 34 tablet 3  . carvedilol (COREG) 12.5 MG tablet Take 1 tablet (12.5 mg total) by mouth 2 (two) times daily with a meal. 60 tablet 3  .  ezetimibe (ZETIA) 10 MG tablet Take 1 tablet (10 mg total) by mouth daily. 30 tablet 3   No current facility-administered medications for this visit.    Past Medical History:  Diagnosis Date  . Anemia   . Chronic kidney disease   . GERD (gastroesophageal reflux disease)   . Hyperlipidemia   . Hypertension   . Warfarin-induced coagulopathy (HCC)    goal inr 2.5 to 3.5    Past Surgical History:  Procedure Laterality Date  . AORTIC VALVE REPLACEMENT    . PACEMAKER INSERTION      Family history No bleeding disorders, clotting disorders, aneurysms, autoimmune diseases, or porphyria   Social History       Tobacco Use  . Smoking status: Never Smoker  . Smokeless tobacco: Never Used  Substance Use Topics  . Alcohol use: No  . Drug use: No     Allergies  Allergen Reactions  . Atorvastatin Other (See Comments)    unknown unknown unknown unknown   . Enalapril Other (See Comments)  . Rosuvastatin Rash    REVIEW OF SYSTEMS (Negative unless checked)  Constitutional: [] ?Weight loss  [] ?Fever  [] ?Chills Cardiac: [] ?Chest pain   [] ?Chest pressure   [] ?Palpitations   [] ?Shortness of breath when laying flat   [] ?  Shortness of breath at rest   [] ?Shortness of breath with exertion. Vascular:  [] ?Pain in legs with walking   [] ?Pain in legs at rest   [] ?Pain in legs when laying flat   [] ?Claudication   [] ?Pain in feet when walking  [] ?Pain in feet at rest  [] ?Pain in feet when laying flat   [] ?History of DVT   [] ?Phlebitis   [] ?Swelling in legs   [] ?Varicose veins   [] ?Non-healing ulcers Pulmonary:   [] ?Uses home oxygen   [] ?Productive cough   [] ?Hemoptysis   [] ?Wheeze  [] ?COPD   [] ?Asthma Neurologic:  [] ?Dizziness  [] ?Blackouts   [] ?Seizures   [] ?History of stroke   [] ?History of TIA  [] ?Aphasia   [] ?Temporary blindness   [] ?Dysphagia   [x] ?Weakness or numbness in arms   [] ?Weakness or numbness in legs Musculoskeletal:  [x] ?Arthritis   [] ?Joint swelling   [] ?Joint pain   [] ?Low  back pain Hematologic:  [x] ?Easy bruising  [x] ?Easy bleeding   [] ?Hypercoagulable state   [x] ?Anemic  [] ?Hepatitis Gastrointestinal:  [] ?Blood in stool   [] ?Vomiting blood  [x] ?Gastroesophageal reflux/heartburn   [] ?Abdominal pain Genitourinary:  [] ?Chronic kidney disease   [] ?Difficult urination  [] ?Frequent urination  [] ?Burning with urination   [] ?Hematuria Skin:  [] ?Rashes   [] ?Ulcers   [] ?Wounds Psychological:  [] ?History of anxiety   [] ? History of major depression.    Physical Examination  BP (!) 121/57 (BP Location: Right Arm)   Pulse 65   Resp 16   Wt 130 lb 9.6 oz (59.2 kg)   BMI 21.08 kg/m  Gen:  WD/WN, NAD Head: [place]/AT, No temporalis wasting. Ear/Nose/Throat: Hearing grossly intact, nares w/o erythema or drainage Eyes: Conjunctiva clear. Sclera non-icteric Neck: Supple.  Trachea midline Pulmonary:  Good air movement, no use of accessory muscles.  Cardiac: RRR, no JVD Vascular:  Vessel Right Left  Radial Palpable Palpable           Musculoskeletal: M/S 5/5 throughout.  No deformity or atrophy. Mild RUE edema. Neurologic: Sensation grossly intact in extremities.  Symmetrical.  Speech is fluent.  Psychiatric: Judgment intact, Mood & affect appropriate for pt's clinical situation. Dermatologic: No rashes or ulcers noted.  No cellulitis or open wounds.       Labs No results found for this or any previous visit (from the past 2160 hour(s)).  Radiology No results found.  Assessment/Plan Type 2 diabetes mellitus with stage 4 chronic kidney disease, without long-term current use of insulin (HCC) blood glucose control important in reducing the progression of atherosclerotic disease. Also, involved in wound healing. On appropriate medications. Chronic kidney disease could worsen her fluid retention as well.   Essential hypertension blood pressure control important in reducing the progression of atherosclerotic disease. On appropriate oral medications.  No  problem-specific Assessment & Plan notes found for this encounter.    Leotis Pain, MD  08/18/2020 11:10 AM    This note was created with Dragon medical transcription system.  Any errors from dictation are purely unintentional

## 2020-08-18 NOTE — Assessment & Plan Note (Signed)
Symptoms are better even without a sleeve. Will follow up PRN.

## 2021-05-13 ENCOUNTER — Telehealth: Payer: Self-pay | Admitting: Oncology

## 2021-05-17 ENCOUNTER — Telehealth: Payer: Self-pay | Admitting: Oncology

## 2021-05-18 ENCOUNTER — Other Ambulatory Visit: Payer: Medicare Other

## 2021-05-18 ENCOUNTER — Inpatient Hospital Stay: Payer: Medicare Other | Admitting: Oncology

## 2021-05-19 NOTE — Telephone Encounter (Signed)
Forward to team

## 2021-06-01 ENCOUNTER — Ambulatory Visit: Payer: Medicare Other | Admitting: Podiatry

## 2021-07-13 ENCOUNTER — Ambulatory Visit
Admission: RE | Admit: 2021-07-13 | Discharge: 2021-07-13 | Disposition: A | Discharge status: Home / Self Care | Payer: Medicare Other | Source: Ambulatory Visit | Attending: Unknown Physician Specialty | Admitting: Unknown Physician Specialty

## 2021-07-13 ENCOUNTER — Other Ambulatory Visit: Payer: Self-pay | Admitting: Unknown Physician Specialty

## 2021-07-13 ENCOUNTER — Other Ambulatory Visit: Payer: Self-pay

## 2021-07-13 DIAGNOSIS — J0140 Acute pansinusitis, unspecified: Secondary | ICD-10-CM

## 2021-07-29 ENCOUNTER — Other Ambulatory Visit: Payer: Self-pay

## 2021-07-29 ENCOUNTER — Ambulatory Visit (INDEPENDENT_AMBULATORY_CARE_PROVIDER_SITE_OTHER): Payer: Medicare Other | Admitting: Podiatry

## 2021-07-29 ENCOUNTER — Encounter: Payer: Self-pay | Admitting: Podiatry

## 2021-07-29 VITALS — BP 107/55 | HR 60 | Temp 97.9°F | Resp 24

## 2021-07-29 DIAGNOSIS — M79675 Pain in left toe(s): Secondary | ICD-10-CM

## 2021-07-29 DIAGNOSIS — E1122 Type 2 diabetes mellitus with diabetic chronic kidney disease: Secondary | ICD-10-CM

## 2021-07-29 DIAGNOSIS — B351 Tinea unguium: Secondary | ICD-10-CM | POA: Diagnosis not present

## 2021-07-29 DIAGNOSIS — N184 Chronic kidney disease, stage 4 (severe): Secondary | ICD-10-CM

## 2021-07-29 DIAGNOSIS — M79674 Pain in right toe(s): Secondary | ICD-10-CM

## 2021-07-30 ENCOUNTER — Encounter: Payer: Self-pay | Admitting: Podiatry

## 2021-07-30 NOTE — Progress Notes (Signed)
°  Subjective:  Patient ID: Katie Mcintosh, female    DOB: 05-04-1934,  MRN: [id number]  Chief Complaint  Patient presents with   Nail Problem    "Do her toenails.  She's on blood thinner.  The main concern is the big toenails."   88 y.o. female returns for the above complaint.  Patient presents with thickened elongated dystrophic toenails x10.  She is on blood thinner.  She would like for me to debride down she is not able to do this so.  She denies any other acute complaints she has not seen anyone else prior to seeing me.  Patient is a diabetic  Objective:   Vitals:   07/29/21 1405  BP: (!) 107/55  Pulse: 60  Resp: (!) 24  Temp: 97.9 F (36.6 C)   Podiatric Exam: Vascular: dorsalis pedis and posterior tibial pulses are palpable bilateral. Capillary return is immediate. Temperature gradient is WNL. Skin turgor WNL  Sensorium: Normal Semmes Weinstein monofilament test. Normal tactile sensation bilaterally. Nail Exam: Pt has thick disfigured discolored nails with subungual debris noted bilateral entire nail hallux through fifth toenails.  Pain on palpation to the nails. Ulcer Exam: There is no evidence of ulcer or pre-ulcerative changes or infection. Orthopedic Exam: Muscle tone and strength are WNL. No limitations in general ROM. No crepitus or effusions noted.  Skin: No Porokeratosis. No infection or ulcers    Assessment & Plan:   1. Pain due to onychomycosis of toenails of both feet     Patient was evaluated and treated and all questions answered.  Onychomycosis with pain  -Nails palliatively debrided as below. -Educated on self-care  Procedure: Nail Debridement Rationale: pain  Type of Debridement: manual, sharp debridement. Instrumentation: Nail nipper, rotary burr. Number of Nails: 10  Procedures and Treatment: Consent by patient was obtained for treatment procedures. The patient understood the discussion of treatment and procedures well. All questions were  answered thoroughly reviewed. Debridement of mycotic and hypertrophic toenails, 1 through 5 bilateral and clearing of subungual debris. No ulceration, no infection noted.  Return Visit-Office Procedure: Patient instructed to return to the office for a follow up visit 3 months for continued evaluation and treatment.  Boneta Lucks, DPM    No follow-ups on file.

## 2021-11-04 ENCOUNTER — Ambulatory Visit: Payer: Medicare Other | Admitting: Podiatry

## 2021-11-15 ENCOUNTER — Ambulatory Visit (INDEPENDENT_AMBULATORY_CARE_PROVIDER_SITE_OTHER): Payer: Medicare Other | Admitting: Podiatry

## 2021-11-15 ENCOUNTER — Encounter: Payer: Self-pay | Admitting: Podiatry

## 2021-11-15 DIAGNOSIS — N184 Chronic kidney disease, stage 4 (severe): Secondary | ICD-10-CM

## 2021-11-15 DIAGNOSIS — I890 Lymphedema, not elsewhere classified: Secondary | ICD-10-CM

## 2021-11-15 DIAGNOSIS — N189 Chronic kidney disease, unspecified: Secondary | ICD-10-CM

## 2021-11-15 DIAGNOSIS — M79674 Pain in right toe(s): Secondary | ICD-10-CM

## 2021-11-15 DIAGNOSIS — E1122 Type 2 diabetes mellitus with diabetic chronic kidney disease: Secondary | ICD-10-CM

## 2021-11-15 DIAGNOSIS — M79675 Pain in left toe(s): Secondary | ICD-10-CM

## 2021-11-15 DIAGNOSIS — B351 Tinea unguium: Secondary | ICD-10-CM

## 2021-11-15 NOTE — Progress Notes (Signed)
This patient returns to my office for at risk foot care.  This patient requires this care by a professional since this patient will be at risk due to having diabetes,CKD and lymphedema.  This patient is unable to cut nails herself since the patient cannot reach her nails.These nails are painful walking and wearing shoes.  This patient presents for at risk foot care today. She presents to the office with her daughter. ? ?General Appearance  Alert, conversant and in no acute stress. ? ?Vascular  Dorsalis pedis and posterior tibial  pulses are palpable  bilaterally.  Capillary return is within normal limits  bilaterally. Temperature is within normal limits  bilaterally. ? ?Neurologic  Senn-Weinstein monofilament wire test within normal limits  bilaterally. Muscle power within normal limits bilaterally. ? ?Nails Thick disfigured discolored nails with subungual debris  from hallux to fifth toes bilaterally. No evidence of bacterial infection or drainage bilaterally. ? ?Orthopedic  No limitations of motion  feet .  No crepitus or effusions noted.  No bony pathology or digital deformities noted. ? ?Skin  normotropic skin with no porokeratosis noted bilaterally.  No signs of infections or ulcers noted.    ? ?Onychomycosis  Pain in right toes  Pain in left toes ? ?Consent was obtained for treatment procedures.   Mechanical debridement of nails 1-5  bilaterally performed with a nail nipper.  Filed with dremel without incident.  ? ? ?Return office visit   3 months                  Told patient to return for periodic foot care and evaluation due to potential at risk complications. ? ? ?Gardiner Barefoot DPM   ?

## 2022-02-10 ENCOUNTER — Ambulatory Visit: Payer: Medicare Other | Admitting: Podiatry

## 2022-05-11 ENCOUNTER — Other Ambulatory Visit: Payer: Self-pay | Admitting: Internal Medicine

## 2022-05-11 DIAGNOSIS — S0990XA Unspecified injury of head, initial encounter: Secondary | ICD-10-CM

## 2022-05-11 DIAGNOSIS — Z9181 History of falling: Secondary | ICD-10-CM

## 2022-05-11 DIAGNOSIS — Z7901 Long term (current) use of anticoagulants: Secondary | ICD-10-CM

## 2022-05-12 ENCOUNTER — Ambulatory Visit
Admission: RE | Admit: 2022-05-12 | Discharge: 2022-05-12 | Disposition: A | Discharge status: Home / Self Care | Payer: Medicare Other | Source: Ambulatory Visit | Attending: Internal Medicine | Admitting: Internal Medicine

## 2022-05-12 DIAGNOSIS — Z7901 Long term (current) use of anticoagulants: Secondary | ICD-10-CM | POA: Diagnosis present

## 2022-05-12 DIAGNOSIS — S0990XA Unspecified injury of head, initial encounter: Secondary | ICD-10-CM | POA: Diagnosis present

## 2022-05-12 DIAGNOSIS — Z9181 History of falling: Secondary | ICD-10-CM | POA: Diagnosis present

## 2022-06-05 ENCOUNTER — Inpatient Hospital Stay
Admission: EM | Admit: 2022-06-05 | Discharge: 2022-06-24 | DRG: 299 | Disposition: E | Payer: Medicare Other | Attending: Internal Medicine | Admitting: Internal Medicine

## 2022-06-05 ENCOUNTER — Encounter: Payer: Self-pay | Admitting: Emergency Medicine

## 2022-06-05 ENCOUNTER — Emergency Department: Payer: Medicare Other

## 2022-06-05 ENCOUNTER — Other Ambulatory Visit: Payer: Self-pay

## 2022-06-05 DIAGNOSIS — J9601 Acute respiratory failure with hypoxia: Secondary | ICD-10-CM | POA: Diagnosis not present

## 2022-06-05 DIAGNOSIS — I2489 Other forms of acute ischemic heart disease: Secondary | ICD-10-CM | POA: Diagnosis present

## 2022-06-05 DIAGNOSIS — Z515 Encounter for palliative care: Secondary | ICD-10-CM

## 2022-06-05 DIAGNOSIS — E213 Hyperparathyroidism, unspecified: Secondary | ICD-10-CM | POA: Diagnosis present

## 2022-06-05 DIAGNOSIS — Z66 Do not resuscitate: Secondary | ICD-10-CM | POA: Diagnosis not present

## 2022-06-05 DIAGNOSIS — E870 Hyperosmolality and hypernatremia: Secondary | ICD-10-CM | POA: Diagnosis present

## 2022-06-05 DIAGNOSIS — E875 Hyperkalemia: Secondary | ICD-10-CM | POA: Diagnosis present

## 2022-06-05 DIAGNOSIS — Z8673 Personal history of transient ischemic attack (TIA), and cerebral infarction without residual deficits: Secondary | ICD-10-CM

## 2022-06-05 DIAGNOSIS — D631 Anemia in chronic kidney disease: Secondary | ICD-10-CM | POA: Diagnosis present

## 2022-06-05 DIAGNOSIS — Z952 Presence of prosthetic heart valve: Secondary | ICD-10-CM

## 2022-06-05 DIAGNOSIS — E785 Hyperlipidemia, unspecified: Secondary | ICD-10-CM | POA: Diagnosis present

## 2022-06-05 DIAGNOSIS — N185 Chronic kidney disease, stage 5: Secondary | ICD-10-CM | POA: Diagnosis present

## 2022-06-05 DIAGNOSIS — I1311 Hypertensive heart and chronic kidney disease without heart failure, with stage 5 chronic kidney disease, or end stage renal disease: Secondary | ICD-10-CM | POA: Diagnosis present

## 2022-06-05 DIAGNOSIS — Z950 Presence of cardiac pacemaker: Secondary | ICD-10-CM

## 2022-06-05 DIAGNOSIS — E43 Unspecified severe protein-calorie malnutrition: Secondary | ICD-10-CM | POA: Diagnosis present

## 2022-06-05 DIAGNOSIS — R627 Adult failure to thrive: Secondary | ICD-10-CM | POA: Diagnosis present

## 2022-06-05 DIAGNOSIS — Z1152 Encounter for screening for COVID-19: Secondary | ICD-10-CM

## 2022-06-05 DIAGNOSIS — Z7901 Long term (current) use of anticoagulants: Secondary | ICD-10-CM | POA: Diagnosis not present

## 2022-06-05 DIAGNOSIS — R571 Hypovolemic shock: Secondary | ICD-10-CM | POA: Diagnosis present

## 2022-06-05 DIAGNOSIS — R4182 Altered mental status, unspecified: Secondary | ICD-10-CM | POA: Diagnosis present

## 2022-06-05 DIAGNOSIS — I4891 Unspecified atrial fibrillation: Secondary | ICD-10-CM | POA: Diagnosis present

## 2022-06-05 DIAGNOSIS — T45515A Adverse effect of anticoagulants, initial encounter: Secondary | ICD-10-CM | POA: Diagnosis present

## 2022-06-05 DIAGNOSIS — R7989 Other specified abnormal findings of blood chemistry: Secondary | ICD-10-CM | POA: Diagnosis present

## 2022-06-05 DIAGNOSIS — N179 Acute kidney failure, unspecified: Secondary | ICD-10-CM | POA: Diagnosis present

## 2022-06-05 DIAGNOSIS — I71019 Dissection of thoracic aorta, unspecified: Secondary | ICD-10-CM | POA: Diagnosis present

## 2022-06-05 DIAGNOSIS — R4701 Aphasia: Secondary | ICD-10-CM | POA: Diagnosis present

## 2022-06-05 DIAGNOSIS — R64 Cachexia: Secondary | ICD-10-CM | POA: Diagnosis present

## 2022-06-05 DIAGNOSIS — E861 Hypovolemia: Secondary | ICD-10-CM | POA: Diagnosis present

## 2022-06-05 DIAGNOSIS — Z87440 Personal history of urinary (tract) infections: Secondary | ICD-10-CM

## 2022-06-05 DIAGNOSIS — G9341 Metabolic encephalopathy: Secondary | ICD-10-CM | POA: Diagnosis present

## 2022-06-05 DIAGNOSIS — R54 Age-related physical debility: Secondary | ICD-10-CM | POA: Diagnosis present

## 2022-06-05 DIAGNOSIS — N189 Chronic kidney disease, unspecified: Secondary | ICD-10-CM | POA: Diagnosis present

## 2022-06-05 DIAGNOSIS — K219 Gastro-esophageal reflux disease without esophagitis: Secondary | ICD-10-CM | POA: Diagnosis present

## 2022-06-05 DIAGNOSIS — R791 Abnormal coagulation profile: Secondary | ICD-10-CM | POA: Diagnosis present

## 2022-06-05 DIAGNOSIS — Z8679 Personal history of other diseases of the circulatory system: Secondary | ICD-10-CM

## 2022-06-05 LAB — COMPREHENSIVE METABOLIC PANEL
ALT: 15 U/L (ref 0–44)
AST: 40 U/L (ref 15–41)
Albumin: 3.2 g/dL — ABNORMAL LOW (ref 3.5–5.0)
Alkaline Phosphatase: 69 U/L (ref 38–126)
Anion gap: 5 (ref 5–15)
BUN: 89 mg/dL — ABNORMAL HIGH (ref 8–23)
CO2: 30 mmol/L (ref 22–32)
Calcium: 13.8 mg/dL (ref 8.9–10.3)
Chloride: 121 mmol/L — ABNORMAL HIGH (ref 98–111)
Creatinine, Ser: 5.74 mg/dL — ABNORMAL HIGH (ref 0.44–1.00)
GFR, Estimated: 7 mL/min — ABNORMAL LOW (ref >60)
Glucose, Bld: 120 mg/dL — ABNORMAL HIGH (ref 70–99)
Potassium: 3.5 mmol/L (ref 3.5–5.1)
Sodium: 156 mmol/L — ABNORMAL HIGH (ref 135–145)
Total Bilirubin: 0.7 mg/dL (ref 0.3–1.2)
Total Protein: 7.7 g/dL (ref 6.5–8.1)

## 2022-06-05 LAB — SODIUM: Sodium: 159 mmol/L — ABNORMAL HIGH (ref 135–145)

## 2022-06-05 LAB — CBC
HCT: 28.2 % — ABNORMAL LOW (ref 36.0–46.0)
Hemoglobin: 8.4 g/dL — ABNORMAL LOW (ref 12.0–15.0)
MCH: 30.2 pg (ref 26.0–34.0)
MCHC: 29.8 g/dL — ABNORMAL LOW (ref 30.0–36.0)
MCV: 101.4 fL — ABNORMAL HIGH (ref 80.0–100.0)
Platelets: 170 10*3/uL (ref 150–400)
RBC: 2.78 MIL/uL — ABNORMAL LOW (ref 3.87–5.11)
RDW: 20.1 % — ABNORMAL HIGH (ref 11.5–15.5)
WBC: 9.6 10*3/uL (ref 4.0–10.5)
nRBC: 0.2 % (ref 0.0–0.2)

## 2022-06-05 LAB — TROPONIN I (HIGH SENSITIVITY)
Troponin I (High Sensitivity): 226 ng/L (ref <18)
Troponin I (High Sensitivity): 249 ng/L (ref <18)

## 2022-06-05 LAB — CK
Total CK: 112 U/L (ref 38–234)
Total CK: 114 U/L (ref 38–234)

## 2022-06-05 MED ORDER — SODIUM CHLORIDE 0.9 % IV BOLUS
500.0000 mL | Freq: Once | INTRAVENOUS | Status: AC
  Administered 2022-06-05: 500 mL via INTRAVENOUS

## 2022-06-05 MED ORDER — LATANOPROST 0.005 % OP SOLN: 1.0000 [drp] | Freq: Every day | OPHTHALMIC | Status: DC

## 2022-06-05 MED ORDER — BRIMONIDINE TARTRATE 0.2 % OP SOLN: 1.0000 [drp] | Freq: Three times a day (TID) | OPHTHALMIC | Status: DC

## 2022-06-05 MED ORDER — ONDANSETRON HCL 4 MG/2ML IJ SOLN
4.0000 mg | Freq: Once | INTRAMUSCULAR | Status: AC
  Administered 2022-06-05: 4 mg via INTRAVENOUS
  Filled 2022-06-05: qty 2

## 2022-06-05 MED ORDER — MORPHINE SULFATE (PF) 4 MG/ML IV SOLN
4.0000 mg | Freq: Once | INTRAVENOUS | Status: AC
  Administered 2022-06-05: 4 mg via INTRAVENOUS
  Filled 2022-06-05: qty 1

## 2022-06-05 MED ORDER — OLOPATADINE HCL 0.1 % OP SOLN: 1.0000 [drp] | Freq: Two times a day (BID) | OPHTHALMIC | Status: DC

## 2022-06-05 MED ORDER — DEXTROSE-NACL 5-0.2 % IV SOLN: INTRAVENOUS | Status: DC

## 2022-06-05 MED ORDER — HYDRALAZINE HCL 50 MG PO TABS: 25.0000 mg | ORAL_TABLET | Freq: Three times a day (TID) | ORAL | Status: DC | PRN

## 2022-06-05 NOTE — Assessment & Plan Note (Signed)
Suspect due to primary or secondary parathyroid gland. Cont with gentle IVF hydration due to hypernatremia.

## 2022-06-05 NOTE — H&P (Signed)
History and Physical    Chief Complaint: Failure to thrive   HISTORY OF PRESENT ILLNESS: Katie Mcintosh is an 88 y.o. female seen in the emergency room today for failure to thrive.  Patient lives at home with her husband and both are elderly and in need of assistance and has daughters taking care of them and not sure if they live with the daughter.  Patient is unable to offer any history.  And at bedside is also elderly.  Daughter was there at bedside initially and has left the room, son and his wife are at bedside but they do not live with the patient.  Daughter did state that mom was conversive this morning and at baseline is ambulatory with her cane. Discussed case with son briefly about mom being frail and in poor health condition and currently with her kidney failure and suspected heart failure and possible heart attack is not doing well and prognosis is guarded.  Patient's nutritional status is poor.  Per report patient has had difficulty swallowing.  Patient has a pacemaker and also has a prosthetic aortic valve and is on anticoagulation with Coumadin.  Patient has had brain surgery and bilateral acute on chronic subdural hematomas in July 2021.  Patient was started on Xarelto that she has not yet picked up for atrial fibrillation.   Pt has PMH as below: Past Medical History:  Diagnosis Date   Anemia    Chronic kidney disease    GERD (gastroesophageal reflux disease)    Hyperlipidemia    Hypertension    Warfarin-induced coagulopathy ([place])    goal inr 2.5 to 3.5   Review of Systems  Unable to perform ROS: Age    Allergies  Allergen Reactions   Atorvastatin Other (See Comments)    unknown unknown unknown unknown    Enalapril Other (See Comments)   Rosuvastatin Rash     Past Surgical History:  Procedure Laterality Date   AORTIC VALVE REPLACEMENT     PACEMAKER INSERTION        Social History   Socioeconomic History   Marital status: Married    Spouse name: Not  on file   Number of children: Not on file   Years of education: Not on file   Highest education level: Not on file  Occupational History   Not on file  Tobacco Use   Smoking status: Never   Smokeless tobacco: Never  Substance and Sexual Activity   Alcohol use: No   Drug use: No   Sexual activity: Not on file  Other Topics Concern   Not on file  Social History Narrative   Not on file   Social Determinants of Health   Financial Resource Strain: Not on file  Food Insecurity: Not on file  Transportation Needs: Not on file  Physical Activity: Not on file  Stress: Not on file  Social Connections: Not on file      CURRENT MEDS:    Current Facility-Administered Medications (Cardiovascular):    hydrALAZINE (APRESOLINE) tablet 25 mg  Current Outpatient Medications (Cardiovascular):    carvedilol (COREG) 12.5 MG tablet, Take 1 tablet (12.5 mg total) by mouth 2 (two) times daily with a meal.   ezetimibe (ZETIA) 10 MG tablet, Take 1 tablet (10 mg total) by mouth daily.   furosemide (LASIX) 40 MG tablet, Take by mouth as needed.   hydrALAZINE (APRESOLINE) 25 MG tablet, 1 tablet 2 (two) times daily.     Current Outpatient Medications (Analgesics):  traMADol (ULTRAM) 50 MG tablet, Take by mouth.   Current Outpatient Medications (Hematological):    cyanocobalamin 1000 MCG tablet, Take by mouth.   ferrous gluconate (FERGON) 324 MG tablet, Take by mouth.   warfarin (COUMADIN) 4 MG tablet, Take 1 tablet (4 mg total) by mouth as directed. '6mg'$  on Sunday and Wednesday (Patient taking differently: Take 5 mg by mouth as directed. 5 mg Mon-Friday,1 mg Sat-Sunday)  Current Facility-Administered Medications (Other):    brimonidine (ALPHAGAN) 0.2 % ophthalmic solution 1 drop   dextrose 5 % and 0.2 % NaCl infusion   latanoprost (XALATAN) 0.005 % ophthalmic solution 1 drop   olopatadine (PATANOL) 0.1 % ophthalmic solution 1 drop  Current Outpatient Medications (Other):    brimonidine  (ALPHAGAN) 0.2 % ophthalmic solution, PLACE 1 DROP INTO BOTH EYES TWICE A DAY (1ST DROP UPON WAKING AND SECOND DROP 8 HOURS LATER)   Cholecalciferol 25 MCG (1000 UT) tablet, Take by mouth.   latanoprost (XALATAN) 0.005 % ophthalmic solution, Place 1 drop into both eyes at bedtime.   Olopatadine HCl (PATADAY OP), Apply to eye daily.   omeprazole (PRILOSEC) 20 MG capsule, Take 1 capsule (20 mg total) by mouth daily. (Patient taking differently: Take 20 mg by mouth as needed.)   sodium bicarbonate 650 MG tablet, Take 650 mg by mouth 2 (two) times daily.    ED Course: Pt in Ed is nonverbal. Unreasponsicve nad aphasic ,. Vitals:   06/06/2022 1650 05/31/2022 1830 06/02/2022 2037 06/04/2022 2349  BP: 132/75 (!) 141/82 135/69 139/72  Pulse: (!) 59 61 60 60  Resp: '16 16 17 15  '$ Temp: (!) 97.5 F (36.4 C)  97.6 F (36.4 C)   TempSrc: Axillary  Oral   SpO2: 99% 100% 100% 100%   Total I/O In: 500 [IV Piggyback:500] Out: -  SpO2: 100 % O2 Flow Rate (L/min): 2 L/min Blood work in ed shows  Results for orders placed or performed during the hospital encounter of 06/14/2022 (from the past 48 hour(s))  CBC     Status: Abnormal   Collection Time: 06/15/2022  4:47 PM  Result Value Ref Range   WBC 9.6 4.0 - 10.5 K/uL   RBC 2.78 (L) 3.87 - 5.11 MIL/uL   Hemoglobin 8.4 (L) 12.0 - 15.0 g/dL   HCT 28.2 (L) 36.0 - 46.0 %   MCV 101.4 (H) 80.0 - 100.0 fL   MCH 30.2 26.0 - 34.0 pg   MCHC 29.8 (L) 30.0 - 36.0 g/dL   RDW 20.1 (H) 11.5 - 15.5 %   Platelets 170 150 - 400 K/uL   nRBC 0.2 0.0 - 0.2 %    Comment: Performed at [facility], [address] [place], [place] [address]  Comprehensive metabolic panel     Status: Abnormal   Collection Time: 06/01/2022  4:47 PM  Result Value Ref Range   Sodium 156 (H) 135 - 145 mmol/L   Potassium 3.5 3.5 - 5.1 mmol/L   Chloride 121 (H) 98 - 111 mmol/L   CO2 30 22 - 32 mmol/L   Glucose, Bld 120 (H) 70 - 99 mg/dL    Comment: Glucose reference range applies only to  samples taken after fasting for at least 8 hours.   BUN 89 (H) 8 - 23 mg/dL   Creatinine, Ser 5.74 (H) 0.44 - 1.00 mg/dL   Calcium 13.8 (HH) 8.9 - 10.3 mg/dL    Comment: CRITICAL RESULT CALLED TO, READ BACK BY AND VERIFIED WITH NATALIE REIFFER ON 06/06/2022  AT 1745 QSD    Total Protein 7.7 6.5 - 8.1 g/dL   Albumin 3.2 (L) 3.5 - 5.0 g/dL   AST 40 15 - 41 U/L   ALT 15 0 - 44 U/L   Alkaline Phosphatase 69 38 - 126 U/L   Total Bilirubin 0.7 0.3 - 1.2 mg/dL   GFR, Estimated 7 (L) >60 mL/min    Comment: (NOTE) Calculated using the CKD-EPI Creatinine Equation (2021)    Anion gap 5 5 - 15    Comment: Performed at [facility], [address] [place], [place] [address]  Troponin I (High Sensitivity)     Status: Abnormal   Collection Time: 06/04/2022  4:47 PM  Result Value Ref Range   Troponin I (High Sensitivity) 226 (HH) <18 ng/L    Comment: CRITICAL RESULT CALLED TO, READ BACK BY AND VERIFIED WITH NATALIE REIFFER ON 05/27/2022 AT 1745 QSD (NOTE) Elevated high sensitivity troponin I (hsTnI) values and significant  changes across serial measurements may suggest ACS but many other  chronic and acute conditions are known to elevate hsTnI results.  Refer to the "Links" section for chest pain algorithms and additional  guidance. Performed at [facility], [address] [place], [place] [address]   CK     Status: None   Collection Time: 06/04/2022  4:47 PM  Result Value Ref Range   Total CK 112 38 - 234 U/L    Comment: Performed at [facility], [address] [place], [place] [address]  CK     Status: None   Collection Time: 06/13/2022 10:20 PM  Result Value Ref Range   Total CK 114 38 - 234 U/L    Comment: Performed at [facility], [address] [place], [place] [address]  Troponin I (High Sensitivity)     Status: Abnormal   Collection Time: 06/02/2022 10:20 PM  Result Value Ref Range   Troponin I (High Sensitivity) 249 (HH) <18 ng/L    Comment:  CRITICAL VALUE NOTED. VALUE IS CONSISTENT WITH PREVIOUSLY REPORTED/CALLED VALUE DLB (NOTE) Elevated high sensitivity troponin I (hsTnI) values and significant  changes across serial measurements may suggest ACS but many other  chronic and acute conditions are known to elevate hsTnI results.  Refer to the "Links" section for chest pain algorithms and additional  guidance. Performed at [facility], [address] [place], [place] [address]   Sodium     Status: Abnormal   Collection Time: 05/27/2022 10:20 PM  Result Value Ref Range   Sodium 159 (H) 135 - 145 mmol/L    Comment: Performed at [facility], [address] [place], [place] [address]    In Ed pt received  Meds ordered this encounter  Medications   sodium chloride 0.9 % bolus 500 mL   morphine (PF) 4 MG/ML injection 4 mg   ondansetron (ZOFRAN) injection 4 mg   dextrose 5 % and 0.2 % NaCl infusion   brimonidine (ALPHAGAN) 0.2 % ophthalmic solution 1 drop   hydrALAZINE (APRESOLINE) tablet 25 mg   latanoprost (XALATAN) 0.005 % ophthalmic solution 1 drop   olopatadine (PATANOL) 0.1 % ophthalmic solution 1 drop    Unresulted Labs (From admission, onward)     Start     Ordered   06/06/22 0500  Comprehensive metabolic panel  Tomorrow morning,   STAT        06/10/2022 2139   06/06/22 0500  CBC  Tomorrow morning,   STAT  06/04/2022 2139   06/02/2022 1643  Urinalysis, Routine w reflex microscopic  Once,   URGENT        05/25/2022 1643             Admission Imaging : CT HEAD WO CONTRAST (5MM)  Result Date: 06/16/2022 CLINICAL DATA:  Mental status change.  Unknown cause. EXAM: CT HEAD WITHOUT CONTRAST TECHNIQUE: Contiguous axial images were obtained from the base of the skull through the vertex without intravenous contrast. RADIATION DOSE REDUCTION: This exam was performed according to the departmental dose-optimization program which includes automated exposure control, adjustment of the mA and/or kV  according to patient size and/or use of iterative reconstruction technique. COMPARISON:  CT examination dated May 12, 2022 FINDINGS: Brain: No evidence of acute infarction, hemorrhage, hydrocephalus, extra-axial collection or mass lesion/mass effect. Encephalomalacia of the left frontal lobe and right temporal/parietal lobe unchanged. Mild cerebral atrophy and chronic microvascular ischemic changes of the white matter. Vascular: No hyperdense vessel or unexpected calcification. Skull: Bilateral frontal and parietal burr holes. Sinuses/Orbits: No acute finding. Other: None. IMPRESSION: 1. No acute intracranial abnormality. 2. Encephalomalacia of the left frontal lobe and right temporal/parietal lobe unchanged. 3. Mild cerebral atrophy and chronic microvascular ischemic changes of the white matter. Electronically Signed   By: Keane Police D.O.   On: 05/28/2022 20:38   DG Chest 1 View  Result Date: 05/30/2022 CLINICAL DATA:  ams EXAM: CHEST  1 VIEW COMPARISON:  Chest x-ray 08/17/2017 FINDINGS: Right chest wall dual lead pacemaker in similar position. Interval worsening of an enlarged cardiac silhouette. Aortic valve replacement. Slightly more prominence aortic arch silhouette. Aortic calcification. No focal consolidation. No pulmonary edema. No pleural effusion. No pneumothorax. No acute osseous abnormality. IMPRESSION: 1. Slightly more prominent aortic arch and cardiac silhouette. Concern for possible increase in aortic aneurysm and possible underlying dissection. Recommend CT angiography chest for further evaluation. 2.  Aortic Atherosclerosis (ICD10-I70.0). Electronically Signed   By: Iven Finn M.D.   On: 06/01/2022 17:49      Physical Examination: Vitals:   06/04/2022 1650 05/30/2022 1830 06/20/2022 2037 05/31/2022 2349  BP: 132/75 (!) 141/82 135/69 139/72  Pulse: (!) 59 61 60 60  Temp: (!) 97.5 F (36.4 C)  97.6 F (36.4 C)   Resp: '16 16 17 15  '$ SpO2: 99% 100% 100% 100%  TempSrc: Axillary  Oral     Physical Exam Vitals reviewed. Exam conducted with a chaperone present.  Constitutional:      Appearance: She is cachectic. She is ill-appearing.  HENT:     Head: Normocephalic and atraumatic.     Right Ear: External ear normal.     Left Ear: External ear normal.     Nose: Nose normal.  Eyes:     Extraocular Movements: Extraocular movements intact.     Pupils: Pupils are equal, round, and reactive to light.  Cardiovascular:     Rate and Rhythm: Normal rate and regular rhythm.     Pulses: Normal pulses.     Heart sounds: Normal heart sounds.  Pulmonary:     Effort: Pulmonary effort is normal.     Breath sounds: Normal breath sounds.  Abdominal:     General: Bowel sounds are normal. There is no distension.     Palpations: Abdomen is soft. There is no mass.     Tenderness: There is no abdominal tenderness. There is no guarding.     Hernia: No hernia is present.  Skin:    General: Skin is warm.  Neurological:     General: No focal deficit present.     Mental Status: She is alert.     GCS: GCS eye subscore is 4. GCS verbal subscore is 1.     Cranial Nerves: Cranial nerves 2-12 are intact. No facial asymmetry.     Motor: Weakness present.     Deep Tendon Reflexes:     Reflex Scores:      Bicep reflexes are 1+ on the right side and 1+ on the left side.      Patellar reflexes are 1+ on the right side and 1+ on the left side. Psychiatric:        Behavior: Behavior is cooperative.      Assessment and Plan: FTT (failure to thrive) in adult FTT for several months pt ahs not been eating and is picky with oher foods.  AMS (altered mental status) From combination if metabolic encephalotomy.  Neuro check. Head ct is negative.  We will follow with labs and day .   Elevated troponin We will follow combination of renal failure and demand ischemia. We will repeat sir 4TNI and follow.   Hypercalcemia Suspect due to primary or secondary parathyroid gland. Cont with gentle IVF  hydration due to hypernatremia.     AKI (acute kidney injury) ([place]) Lab Results  Component Value Date   CREATININE 5.74 (H) 06/13/2022   CREATININE 2.18 (H) 08/17/2017   CREATININE 1.8 (H) 04/02/2012   Acute worsening on chronic kidney disease due to decreased dehydration and decreased po intake.  Pt needs higher level care or hospice.  Her condition  guarded. Foley. Avoid contrast.  Attribute to prerenal and possible renal etiology.   Anemia in chronic kidney disease    Latest Ref Rng & Units 05/30/2022    4:47 PM 08/17/2017    8:16 PM 03/15/2012    8:45 AM  CBC  WBC 4.0 - 10.5 K/uL 9.6  6.1  4.4   Hemoglobin 12.0 - 15.0 g/dL 8.4  9.6  10.0   Hematocrit 36.0 - 46.0 % 28.2  29.0  31.0   Platelets 150 - 400 K/uL 170  113  121.0   Pt is on coumadin and is at risk for bleeding with her h/o ICH in 2015.  With her current anemia I will start scd. Type and screen.      DVT prophylaxis:  SCD's   Code Status:  Full    Family Communication:  Santiago Glad daughter.   Disposition Plan:  TBD.   Consults called:  None   Admission status: Inpatient.    Unit/ Expected LOS: Pcu . / 5 days   Para Skeans MD [facility]  6 PM- 2 AM. Please contact me via secure Chat 6 PM-2 AM. [phone] ( Pager ) To contact the [facility] Attending or Consulting provider [place] or covering provider during after hours [place], for this patient.   Check the care team in [facility] and look for a) attending/consulting [facility] provider listed and b) the [facility] team listed Log into [email or web address] and use [place]'s universal password to access. If you do not have the password, please contact the hospital operator. Locate the [facility] provider you are looking for under [facility] and page to a number that you can be directly reached. If you still have difficulty reaching the provider, please page the [facility] (Director on Call) for the Hospitalists listed on amion for assistance. [email or web address] 06/23/2022, 11:56  PM

## 2022-06-05 NOTE — Assessment & Plan Note (Signed)
Multifactorial secondary to hypercalcemia, uremia from worsening kidney injury and overall poor metabolic intake.

## 2022-06-05 NOTE — ED Notes (Signed)
Called lab at this time for 2nd troponin recollection.

## 2022-06-05 NOTE — ED Notes (Signed)
As this RN was receiving report from off going RN, it was noted that pt's O2 was 65%. Pt was placed on nonrebreather and sats quickly increased to 100% . Provider at bedside. Pt weaned to nasal cannula with 2L of O2 and sats currently 100%

## 2022-06-05 NOTE — ED Triage Notes (Signed)
Pt via EMS from home. Per family, pt has been increasingly weak and loss of appetite. Daughter at bedside saying recently she has been less responsive. Pt is alert and responsive to pain. Pt family states that she has been like this for past couple of days.

## 2022-06-05 NOTE — ED Notes (Signed)
Family requesting pain medication. Pt seems uncomfortable, occasionally groaning and moving around in the bed. Dr. Corky Downs, EDP made aware see new orders.

## 2022-06-05 NOTE — ED Notes (Addendum)
Pt being transported to XR at this time. Daughter refused CT of head at this time, states she just had one 3 weeks ago and states that there has been no change in her mental status since then. Dr. Corky Downs at bedside to speak with daughter.

## 2022-06-05 NOTE — Hospital Course (Signed)
Patient is an 88 year old female with past medical history of mitral valve repair with mechanical valve on chronic Coumadin, stage V chronic kidney disease with no plans for dialysis, previous history of subdural hemorrhage, severe protein calorie malnutrition and glaucoma who presented to the emergency room for failure to thrive.  According to the patient's son, for the last few days especially, patient is been eating very little and has been declining in her ability to interact.  He did state that she has been taking her medications.    Patient had a recent hospitalization 3 weeks prior at [facility] for S[place]atia UTI and had completed her antibiotic course.  In the emergency room, patient was noted to have a sodium of 156, creatinine of 5.74, hemoglobin of 8.4 and calcium of 13.8.  Admitted to the hospitalist service for failure to thrive.  Following admission, patient started developing hypoxia and hypotension.  Hypotension required pressor support and aggressive fluid resuscitation.  Chest x-ray with noted enlargement of aorta concerning for possible dissection.  Patient underwent chest CT on morning of 11/13 in discussion with reading radiologist, CT noted diffusely dilated thoracic aorta findings consistent with associated acute dissection and area of more focal dilatation measuring up to 8.4 cm at area of distal aortic arch proximal descending thoracic aorta with associated hyperdense dissection flap.  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  Her comorbidities prevented surgery from being an option.  Patient's husband present, but he himself has underlying Alzheimer's dementia.  He was explained the situation understands, but did not participate in decision-making process.  After discussion amongst themselves, patient's children requested patient be made DNR.  Soon after, they  request that she be made comfort care.  Patient transferred to floor given hypoxia, started on morphine drip.  Pressor support discontinued.

## 2022-06-05 NOTE — ED Provider Notes (Signed)
[facility] Provider Note    Event Date/Time   First MD Initiated Contact with Patient 05/28/2022 1638     (approximate)   History   Failure To Thrive   HPI  Katie Mcintosh is a 88 y.o. female with a history of chronic kidney disease brought in by family for decreased p.o. intake over the last week.  Apparently the patient was able to ambulate and go to her doctor's appointments several weeks ago but over the last couple of weeks has declined significantly.  Review of records demonstrates the patient did attend her nephrology appointment on November 2.  Per that note she does have a history of chronic anticoagulation on Coumadin because of a prosthetic aortic valve, bilateral acute on chronic subdural hematomas in July 2021 hospitalization for severe anemia in the past.  Per nephrology note patient has not wanted to have life prolonged with dialysis     Physical Exam   Triage Vital Signs: ED Triage Vitals  Enc Vitals Group     BP      Pulse      Resp      Temp      Temp src      SpO2      Weight      Height      Head Circumference      Peak Flow      Pain Score      Pain Loc      Pain Edu?      Excl. in [place]?     Most recent vital signs: Vitals:   05/27/2022 1650  BP: 132/75  Pulse: (!) 59  Resp: 16  Temp: (!) 97.5 F (36.4 C)  SpO2: 99%     General: Awake, but not interactive, cachectic CV:  Good peripheral perfusion.  Resp:  Normal effort.  Clear to auscultation bilaterally Abd:  No tenderness palpation Other:  Pharynx: Dry, lower extremities without rash or swelling   ED Results / Procedures / Treatments   Labs (all labs ordered are listed, but only abnormal results are displayed) Labs Reviewed  CBC - Abnormal; Notable for the following components:      Result Value   RBC 2.78 (*)    Hemoglobin 8.4 (*)    HCT 28.2 (*)    MCV 101.4 (*)    MCHC 29.8 (*)    RDW 20.1 (*)    All other components within normal limits   COMPREHENSIVE METABOLIC PANEL - Abnormal; Notable for the following components:   Sodium 156 (*)    Chloride 121 (*)    Glucose, Bld 120 (*)    BUN 89 (*)    Creatinine, Ser 5.74 (*)    Calcium 13.8 (*)    Albumin 3.2 (*)    GFR, Estimated 7 (*)    All other components within normal limits  TROPONIN I (HIGH SENSITIVITY) - Abnormal; Notable for the following components:   Troponin I (High Sensitivity) 226 (*)    All other components within normal limits  CK  URINALYSIS, ROUTINE W REFLEX MICROSCOPIC     EKG  ED ECG REPORT I, Lavonia Drafts, the attending physician, personally viewed and interpreted this ECG.   Rhythm: Ventricular paced rhythm QRS Axis: Abnormal Intervals: normal ST/T Wave abnormalities: Nonspecific changes Narrative Interpretation: no evidence of acute ischemia    RADIOLOGY Chest x-ray viewed interpreted by me, no evidence of pneumonia, radiology notes possible change in aortic arch given history of  aortic aneurysm    PROCEDURES:  Critical Care performed: yes  CRITICAL CARE Performed by: Lavonia Drafts   Total critical care time: 30 minutes  Critical care time was exclusive of separately billable procedures and treating other patients.  Critical care was necessary to treat or prevent imminent or life-threatening deterioration.  Critical care was time spent personally by me on the following activities: development of treatment plan with patient and/or surrogate as well as nursing, discussions with consultants, evaluation of patient's response to treatment, examination of patient, obtaining history from patient or surrogate, ordering and performing treatments and interventions, ordering and review of laboratory studies, ordering and review of radiographic studies, pulse oximetry and re-evaluation of patient's condition.   Procedures   MEDICATIONS ORDERED IN ED: Medications  sodium chloride 0.9 % bolus 500 mL (has no administration in time range)      IMPRESSION / MDM / ASSESSMENT AND PLAN / ED COURSE  I reviewed the triage vital signs and the nursing notes. Patient's presentation is most consistent with severe exacerbation of chronic illness.  Patient presents with decreased p.o. intake, decreased energy, weight loss  Exam she is cachectic and very weak, she appears confused.  Differential is extensive however given her history of chronic kidney disease question possible uremia, differential also includes sepsis, infection, electrolyte abnormalities.  CT head had been ordered however daughter refused, she states that she had a CT done 3 weeks ago  Labs pending  Lab work with multiple abnormalities including hypercalcemia 13.8, elevated troponin of 226, this may be related to chronic kidney disease, BUN of 89, creatinine of 5.74, sodium of 156  Discussed with Dr. Candiss Norse of nephrology who recommends IV fluids as the patient has declined dialysis multiple times in the past  Discussed with family and informed them that it is doubtful that aggressive measures will reverse the underlying cause of patient's decline and to consider whether comfort care may be more appropriate.  They have informed me that they would like "all measures taken"  Unable to obtain CT angiography at this time given multiple electrolyte abnormalities and GFR of 7, no reports of chest pain      FINAL CLINICAL IMPRESSION(S) / ED DIAGNOSES   Final diagnoses:  Hypercalcemia  Hypernatremia  Hyperkalemia  Failure to thrive in adult     Rx / DC Orders   ED Discharge Orders     None        Note:  This document was prepared using Dragon voice recognition software and may include unintentional dictation errors.   Lavonia Drafts, MD 06/21/2022 [phone]

## 2022-06-05 NOTE — Assessment & Plan Note (Signed)
Patient now comfort care.  Patient was not a dialysis candidate prior to this.  Worsening kidney injury likely due to hypovolemic shock.

## 2022-06-05 NOTE — Assessment & Plan Note (Signed)
We will follow combination of renal failure and demand ischemia. We will repeat sir 4TNI and follow.

## 2022-06-05 NOTE — Assessment & Plan Note (Addendum)
    Latest Ref Rng & Units 05/26/2022    4:47 PM 08/17/2017    8:16 PM 03/15/2012    8:45 AM  CBC  WBC 4.0 - 10.5 K/uL 9.6  6.1  4.4   Hemoglobin 12.0 - 15.0 g/dL 8.4  9.6  10.0   Hematocrit 36.0 - 46.0 % 28.2  29.0  31.0   Platelets 150 - 400 K/uL 170  113  121.0   Pt is on coumadin and is at risk for bleeding with her h/o ICH in 2015.  With her current anemia I will start scd. Type and screen.

## 2022-06-05 NOTE — ED Notes (Signed)
Admitting Provider at bedside. 

## 2022-06-05 NOTE — Assessment & Plan Note (Signed)
FTT had been steadily increasing over the past few months

## 2022-06-06 ENCOUNTER — Inpatient Hospital Stay: Payer: Medicare Other

## 2022-06-06 DIAGNOSIS — J9601 Acute respiratory failure with hypoxia: Secondary | ICD-10-CM | POA: Diagnosis not present

## 2022-06-06 DIAGNOSIS — I71019 Dissection of thoracic aorta, unspecified: Secondary | ICD-10-CM

## 2022-06-06 DIAGNOSIS — G9341 Metabolic encephalopathy: Secondary | ICD-10-CM

## 2022-06-06 DIAGNOSIS — R571 Hypovolemic shock: Secondary | ICD-10-CM

## 2022-06-06 DIAGNOSIS — I7100 Dissection of unspecified site of aorta: Secondary | ICD-10-CM | POA: Insufficient documentation

## 2022-06-06 LAB — COMPREHENSIVE METABOLIC PANEL
ALT: 14 U/L (ref 0–44)
AST: 43 U/L — ABNORMAL HIGH (ref 15–41)
Albumin: 3.2 g/dL — ABNORMAL LOW (ref 3.5–5.0)
Alkaline Phosphatase: 67 U/L (ref 38–126)
Anion gap: 7 (ref 5–15)
BUN: 90 mg/dL — ABNORMAL HIGH (ref 8–23)
CO2: 30 mmol/L (ref 22–32)
Calcium: 14 mg/dL (ref 8.9–10.3)
Chloride: 117 mmol/L — ABNORMAL HIGH (ref 98–111)
Creatinine, Ser: 6.26 mg/dL — ABNORMAL HIGH (ref 0.44–1.00)
GFR, Estimated: 6 mL/min — ABNORMAL LOW (ref >60)
Glucose, Bld: 161 mg/dL — ABNORMAL HIGH (ref 70–99)
Potassium: 4.1 mmol/L (ref 3.5–5.1)
Sodium: 154 mmol/L — ABNORMAL HIGH (ref 135–145)
Total Bilirubin: 0.8 mg/dL (ref 0.3–1.2)
Total Protein: 7.9 g/dL (ref 6.5–8.1)

## 2022-06-06 LAB — CBC
HCT: 27.9 % — ABNORMAL LOW (ref 36.0–46.0)
Hemoglobin: 8.5 g/dL — ABNORMAL LOW (ref 12.0–15.0)
MCH: 31 pg (ref 26.0–34.0)
MCHC: 30.5 g/dL (ref 30.0–36.0)
MCV: 101.8 fL — ABNORMAL HIGH (ref 80.0–100.0)
Platelets: 165 10*3/uL (ref 150–400)
RBC: 2.74 MIL/uL — ABNORMAL LOW (ref 3.87–5.11)
RDW: 19.9 % — ABNORMAL HIGH (ref 11.5–15.5)
WBC: 7.7 10*3/uL (ref 4.0–10.5)
nRBC: 0.3 % — ABNORMAL HIGH (ref 0.0–0.2)

## 2022-06-06 LAB — PROTIME-INR
INR: 2.3 — ABNORMAL HIGH (ref 0.8–1.2)
Prothrombin Time: 24.8 seconds — ABNORMAL HIGH (ref 11.4–15.2)

## 2022-06-06 LAB — SARS CORONAVIRUS 2 BY RT PCR: SARS Coronavirus 2 by RT PCR: NEGATIVE

## 2022-06-06 MED ORDER — GLYCOPYRROLATE 0.2 MG/ML IJ SOLN: 0.2000 mg | INTRAMUSCULAR | Status: DC | PRN

## 2022-06-06 MED ORDER — MORPHINE SULFATE (PF) 2 MG/ML IV SOLN: 1.0000 mg | INTRAVENOUS | Status: DC | PRN

## 2022-06-06 MED ORDER — BIOTENE DRY MOUTH MT LIQD: 15.0000 mL | OROMUCOSAL | Status: DC | PRN

## 2022-06-06 MED ORDER — HALOPERIDOL 0.5 MG PO TABS: 0.5000 mg | ORAL_TABLET | ORAL | Status: DC | PRN

## 2022-06-06 MED ORDER — POLYVINYL ALCOHOL 1.4 % OP SOLN: 1.0000 [drp] | Freq: Four times a day (QID) | OPHTHALMIC | Status: DC | PRN

## 2022-06-06 MED ORDER — SODIUM CHLORIDE 0.9 % IV SOLN: INTRAVENOUS | Status: DC

## 2022-06-06 MED ORDER — LORAZEPAM 2 MG/ML IJ SOLN
0.5000 mg | INTRAMUSCULAR | Status: DC | PRN
  Administered 2022-06-06: 0.5 mg via INTRAVENOUS
  Filled 2022-06-06: qty 1

## 2022-06-06 MED ORDER — ONDANSETRON HCL 4 MG/2ML IJ SOLN: 4.0000 mg | Freq: Four times a day (QID) | INTRAMUSCULAR | Status: DC | PRN

## 2022-06-06 MED ORDER — ONDANSETRON 4 MG PO TBDP: 4.0000 mg | ORAL_TABLET | Freq: Four times a day (QID) | ORAL | Status: DC | PRN

## 2022-06-06 MED ORDER — ACETAMINOPHEN 650 MG RE SUPP: 650.0000 mg | Freq: Four times a day (QID) | RECTAL | Status: DC | PRN

## 2022-06-06 MED ORDER — GLYCOPYRROLATE 1 MG PO TABS: 1.0000 mg | ORAL_TABLET | ORAL | Status: DC | PRN

## 2022-06-06 MED ORDER — NOREPINEPHRINE 4 MG/250ML-% IV SOLN
INTRAVENOUS | Status: AC
  Administered 2022-06-06: 2 ug/min via INTRAVENOUS
  Filled 2022-06-06: qty 250

## 2022-06-06 MED ORDER — ACETAMINOPHEN 325 MG PO TABS: 650.0000 mg | ORAL_TABLET | Freq: Four times a day (QID) | ORAL | Status: DC | PRN

## 2022-06-06 MED ORDER — HALOPERIDOL LACTATE 2 MG/ML PO CONC: 0.5000 mg | ORAL | Status: DC | PRN

## 2022-06-06 MED ORDER — MORPHINE 100MG IN NS 100ML (1MG/ML) PREMIX INFUSION
1.0000 mg/h | INTRAVENOUS | Status: DC
  Administered 2022-06-06 (×2): 1 mg/h via INTRAVENOUS
  Filled 2022-06-06: qty 100

## 2022-06-06 MED ORDER — HALOPERIDOL LACTATE 5 MG/ML IJ SOLN: 0.5000 mg | INTRAMUSCULAR | Status: DC | PRN

## 2022-06-06 MED ORDER — NOREPINEPHRINE 4 MG/250ML-% IV SOLN: 0.0000 ug/min | INTRAVENOUS | Status: DC

## 2022-06-06 NOTE — Assessment & Plan Note (Addendum)
Patient comfort care.  Family understood that there is little that could be done given comorbidities.  Noted significant dilatation secondary to dissection.  In addition, she was supposed to be on anticoagulation (INR 2.3 on morning of 11/13) to ensure no thrombus.  Patient expired on 11/14 morning

## 2022-06-06 NOTE — ED Notes (Signed)
This RN reached out to Dr. Maryland Pink about pt's critical Calcium level, and notified him that she has not urinated since yesterday. This RN also expressed concern to MD about family discord, and disagreement regarding care of pt. This RN requested palliative care, and possibly social work consult.

## 2022-06-06 NOTE — ED Notes (Signed)
Pt. Cardiac monitor switched to comfort care.

## 2022-06-06 NOTE — ED Notes (Signed)
Pt. And this RN back to ED rm. 5. Pt. Placed on HFNC.

## 2022-06-06 NOTE — ED Notes (Signed)
Pt's BP dropped, This RN paged Dr. Maryland Pink to notify, VO for levophed obtained. Dr. Maryland Pink states he will come down to see pt.

## 2022-06-06 NOTE — ED Notes (Addendum)
This RN called [employer] [phone] to discontinue pacemaker. Pacemaker rep will be notified and will reach out to hospital staff.

## 2022-06-06 NOTE — ED Notes (Signed)
Pt's family at bedside have decided to make pt. DNR. Dr. Maryland Pink notified. Family states they still have to talk about whether they are going to transition to comfort care. This RN notified MD that if pt. Is not going to be comfort care she will need a higher level of care because of her levophed drip.

## 2022-06-06 NOTE — ED Notes (Signed)
Per dr. Maryland Pink, bair hugger discontinued.

## 2022-06-06 NOTE — ED Notes (Signed)
Spoke with clarence from [employer], states no magnet, or "turn off" of pacemaker needed. As pt's HR slows pacer will as allow it to and when pt. passes it will not affect her, and will be asystole on any cardiac monitor.

## 2022-06-06 NOTE — Assessment & Plan Note (Addendum)
Unclear etiology.  No evidence of pneumonia or infection.  Patient did receive some fluids initially, but not enough to cause significant volume overload.  If that she is comfort care, will still need to check COVID to ensure no risk of exposure.  Oxygen and morphine for comfort.

## 2022-06-06 NOTE — ED Notes (Signed)
Morpine infusion verified with Juan Quam RN

## 2022-06-06 NOTE — ED Notes (Signed)
This RN and Geologist, engineering to bedside to clean pt. Change linens and check urinary status. Pts purewick in place and dry, brief is dry, small black stool smear on brief. Rectal temp 95 degrees. Pt. Changed into gown, repositioned for comfort. Dr. Maryland Pink notified. Pt. Placed on bair hugger.

## 2022-06-06 NOTE — ED Notes (Signed)
Pt. Having short runs of vtach, Dr. Maryland Pink notified.

## 2022-06-06 NOTE — ED Notes (Signed)
Pt's son and spouse to bedside. Pt's daughter to [place].

## 2022-06-06 NOTE — ED Notes (Signed)
Dr. Krishnan at bedside. 

## 2022-06-06 NOTE — ED Notes (Signed)
Lab aware of need for them to draw routine labs per family request

## 2022-06-06 NOTE — ED Notes (Signed)
IV team to bedside. 

## 2022-06-06 NOTE — ED Notes (Signed)
This RN, Dr. Maryland Pink and pt. To CT. Pt. Began to desat to 85% on 4L, this RN increased to 6L [place] and per Dr. Maryland Pink, called RT to meet pt. Back in room with high flow [place].

## 2022-06-06 NOTE — ED Notes (Signed)
Dr. Maryland Pink confirmed application of bair hugger and insertion of temp foley to monitor pt's core temp and urinary output.

## 2022-06-06 NOTE — ED Notes (Addendum)
Pt's oxygen sat dropped to 85%, pt. Placed on 4L [place], sats to 87%. Pt. Placed on 6L [place]. Pt. Coughing lightly, HOB raised in case of emesis. Pt. Appears more agitated. Dr. Maryland Pink notified.

## 2022-06-06 NOTE — Progress Notes (Signed)
   06/06/22 1330  Clinical Encounter Type  Visited With Patient and family together  Visit Type Initial;Critical Care;ED  Referral From Nurse  Consult/Referral To Chaplain  Spiritual Encounters  Spiritual Needs Prayer;Grief support   Chaplain responded to phone call to provide EOL care and support to family at bedside.

## 2022-06-06 NOTE — ED Notes (Signed)
Chaplain to bedside

## 2022-06-06 NOTE — Progress Notes (Addendum)
[facility] Progress Note  Patient: Katie Mcintosh    ZOX:[id number]  DOA: 05/28/2022    Date of Service: the patient was seen and examined on 06/06/2022  Brief hospital course: Patient is an 88 year old female with past medical history of mitral valve repair with mechanical valve on chronic Coumadin, stage V chronic kidney disease with no plans for dialysis, previous history of subdural hemorrhage, severe protein calorie malnutrition and glaucoma who presented to the emergency room for failure to thrive.  According to the patient's son, for the last few days especially, patient is been eating very little and has been declining in her ability to interact.  He did state that she has been taking her medications.    Patient had a recent hospitalization 3 weeks prior at [facility] for [place] UTI and had completed her antibiotic course.  In the emergency room, patient was noted to have a sodium of 156, creatinine of 5.74, hemoglobin of 8.4 and calcium of 13.8.  Admitted to the hospitalist service for failure to thrive.  Following admission, patient started developing hypoxia and hypotension.  Hypotension required pressor support and aggressive fluid resuscitation.  Chest x-ray with noted enlargement of aorta concerning for possible dissection.  Patient underwent chest CT on morning of 11/13 in discussion with reading radiologist, CT noted diffusely dilated thoracic aorta findings consistent with associated acute dissection and area of more focal dilatation measuring up to 8.4 cm at area of distal aortic arch proximal descending thoracic aorta with associated hyperdense dissection flap.  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  Her comorbidities prevented surgery from being an option.  Patient's husband present, but he himself has underlying Alzheimer's dementia.  He was explained  the situation understands, but did not participate in decision-making process.  After discussion amongst themselves, patient's children requested patient be made DNR.  Soon after, they request that she be made comfort care.  Patient transferred to floor given hypoxia, started on morphine drip.  Pressor support discontinued.  Assessment and Plan: Assessment and Plan: * Acute thoracic aortic dissection [facility]) Patient comfort care.  Family understands that there is little that can be done.  Given significant dilatation secondary to dissection.  In addition, she is supposed to be on anticoagulation (INR 2.3 on morning of 11/13) to ensure no thrombus.  Hypovolemic shock (HCC) Pressor support initially, discontinued after comfort care.  Acute respiratory failure with hypoxia (HCC) Unclear etiology.  No evidence of pneumonia or infection.  Patient did receive some fluids initially, but not enough to cause significant volume overload.  If that she is comfort care, will still need to check COVID to ensure no risk of exposure.  Oxygen now for comfort  Acute metabolic encephalopathy Multifactorial secondary to hypercalcemia, uremia from worsening kidney injury and overall poor metabolic intake.   AKI (acute kidney injury) ([place]) in the setting of stage V chronic kidney disease Patient now comfort care.  Patient was not a dialysis candidate prior to this.  Worsening kidney injury likely due to hypovolemic shock.  Hypercalcemia Possibly hyperparathyroidism?  Work-up canceled due to patient's comfort care.  Of note, patient's calcium during hospitalization last month at [facility] was elevated, although not to this degree.  Initially was getting aggressive fluid resuscitation which has been discontinued due to comfort care    Elevated troponin We will follow combination of renal failure and demand ischemia. No further work-up  Anemia  in chronic kidney disease    Latest Ref Rng & Units 05/25/2022     4:47 PM 08/17/2017    8:16 PM 03/15/2012    8:45 AM  CBC  WBC 4.0 - 10.5 K/uL 9.6  6.1  4.4   Hemoglobin 12.0 - 15.0 g/dL 8.4  9.6  10.0   Hematocrit 36.0 - 46.0 % 28.2  29.0  31.0   Platelets 150 - 400 K/uL 170  113  121.0   Pt is on coumadin and is at risk for bleeding with her h/o ICH in 2015.  With her current anemia I will start scd. Type and screen.     FTT (failure to thrive) in adult FTT for several months pt ahs not been eating and is picky with oher foods.       There is no height or weight on file to calculate BMI.        Consultants: None  Procedures: None  Antimicrobials: None  Code Status: DNR, now comfort care   Subjective: Mostly obtunded  Objective: Noted hypotension requiring pressor support Vitals:   06/06/22 1615 06/06/22 1630  BP:  110/61  Pulse: 100 99  Resp: 14 (!) 21  Temp: (!) 96.4 F (35.8 C) (!) 96.3 F (35.7 C)  SpO2: 97% 96%    Intake/Output Summary (Last 24 hours) at 06/06/2022 1716 Last data filed at 06/06/2022 1614 Gross per 24 hour  Intake 1574.18 ml  Output --  Net 1574.18 ml   There were no vitals filed for this visit. There is no height or weight on file to calculate BMI.  Exam:  General: Obtunded, does not follow commands HEENT: Normocephalic, atraumatic, mucous membranes are dry Cardiovascular: Mild tachycardic Respiratory: Poor inspiratory effort Abdomen: Soft, nondistended, hypoactive bowel sounds Musculoskeletal: No clubbing or cyanosis or edema Skin: Skin is dry with poor capillary refill Psychiatry: Obtunded, unable to assess appropriateness Neurology: Limited exam due to patient being obtunded  Data Reviewed: Sodium of 154, creatinine of 6.26 which is worse, calcium of 14, hemoglobin of 8.5  Disposition:  Status is: Inpatient Remains inpatient appropriate because:  -Actively dying    Anticipated discharge date: Anticipate patient will likely pass in the next 24 to 48 hours in the  hospital  Family Communication: Multiple family numbers at the bedside DVT Prophylaxis: DVT prophylaxis discontinued as she is now comfort care  Prior to being made comfort care, 70 minutes were spent in the care of this critically ill patient including medical decision making, bedside examination, interpretation of labs and tests and discuss with radiology consultants  Author: Annita Brod ,MD 06/06/2022 5:16 PM  To reach On-call, see care teams to locate the attending and reach out via [email or web address]. Between 7PM-7AM, please contact night-coverage If you still have difficulty reaching the attending provider, please page the [facility] (Director on Call) for [facility] on amion for assistance.

## 2022-06-06 NOTE — ED Notes (Signed)
Family notified 4 family members can be at bedside at a time. Confirmed with charge RN

## 2022-06-06 NOTE — Assessment & Plan Note (Signed)
Pressor support initially, discontinued after comfort care.

## 2022-06-06 NOTE — ED Notes (Signed)
Dr. Maryland Pink spekaing with family about end of life plans.

## 2022-06-06 NOTE — Progress Notes (Signed)
[facility]  ROUNDING NOTE   Subjective:   Katie Mcintosh is a 88 year old female with past medical conditions including metallic aortic valve replacement with chronic anticoagulation, aortic aneurysm, atrial fibrillation, subdural hematoma, CVAs, pacemaker, and chronic kidney disease stage V.  Patient presents to the emergency department with failure to thrive.  She has been admitted for Failure to thrive in adult [R62.7]  Patient is known to our practice and is followed outpatient by Dr. Candiss Norse.  Patient was last seen in office on November 2.Patient laying on stretcher, ill appearing. Daughter at bedside. States patient does refuse to eat at times but can be convinced to take a couple bites. States she has Bms daily, some incontinent. She must be lifted or max assisted to [facility].   Labs on ED arrival include sodium 156, BUN 89, Calcium 13.8, creatinine  5.74 and GFR 7. Troponin 226 and Hgb 8.4. Chest Xray shows prominent aortic arch and silouette, Ct chest shows aortic dissection measuring 8.4 cm in the distal aortic arch.  We have been consulted to evaluate kidney injury.   Objective:  Vital signs in last 24 hours:  Temp:  [94.2 F (34.6 C)-98.2 F (36.8 C)] 96.9 F (36.1 C) (11/13 1315) Pulse Rate:  [26-88] 66 (11/13 1315) Resp:  [10-28] 21 (11/13 1315) BP: (78-154)/(32-111) 131/76 (11/13 1300) SpO2:  [65 %-100 %] 100 % (11/13 1315)  Weight change:  There were no vitals filed for this visit.  Intake/Output: I/O last 3 completed shifts: In: 500 [IV Piggyback:500] Out: -    Intake/Output this shift:  Total I/O In: 657.5 [I.V.:657.5] Out: -   Physical Exam: General: Ill-appearing  Head: Normocephalic, atraumatic.   Eyes: Anicteric  Lungs:  Diminished in bases, shallow breath  Heart: Regular rate and rhythm  Abdomen:  Soft, nontender  Extremities: No peripheral edema.  Neurologic: somnolent  Skin: No lesions  Access: None    Basic Metabolic Panel: Recent  Labs  Lab 06/04/2022 1647 05/27/2022 2220 06/06/22 0554  NA 156* 159* 154*  K 3.5  --  4.1  CL 121*  --  117*  CO2 30  --  30  GLUCOSE 120*  --  161*  BUN 89*  --  90*  CREATININE 5.74*  --  6.26*  CALCIUM 13.8*  --  14.0*    Liver Function Tests: Recent Labs  Lab 06/06/2022 1647 06/06/22 0554  AST 40 43*  ALT 15 14  ALKPHOS 69 67  BILITOT 0.7 0.8  PROT 7.7 7.9  ALBUMIN 3.2* 3.2*   No results for input(s): "LIPASE", "AMYLASE" in the last 168 hours. No results for input(s): "AMMONIA" in the last 168 hours.  CBC: Recent Labs  Lab 06/06/2022 1647 06/06/22 0554  WBC 9.6 7.7  HGB 8.4* 8.5*  HCT 28.2* 27.9*  MCV 101.4* 101.8*  PLT 170 165    Cardiac Enzymes: Recent Labs  Lab 05/31/2022 1647 06/13/2022 2220  CKTOTAL 112 114    BNP: Invalid input(s): "POCBNP"  CBG: No results for input(s): "GLUCAP" in the last 168 hours.  Microbiology: No results found for this or any previous visit.  Coagulation Studies: Recent Labs    06/06/22 1128  LABPROT 24.8*  INR 2.3*    Urinalysis: No results for input(s): "COLORURINE", "LABSPEC", "PHURINE", "GLUCOSEU", "HGBUR", "BILIRUBINUR", "KETONESUR", "PROTEINUR", "UROBILINOGEN", "NITRITE", "LEUKOCYTESUR" in the last 72 hours.  Invalid input(s): "APPERANCEUR"    Imaging: CT CHEST WO CONTRAST  Result Date: 06/06/2022 CLINICAL DATA:  Possible dissection seen on chest x-ray  EXAM: CT CHEST WITHOUT CONTRAST TECHNIQUE: Multidetector CT imaging of the chest was performed following the standard protocol without IV contrast. RADIATION DOSE REDUCTION: This exam was performed according to the departmental dose-optimization program which includes automated exposure control, adjustment of the mA and/or kV according to patient size and/or use of iterative reconstruction technique. COMPARISON:  Chest x-ray dated August 17, 2017 FINDINGS: Cardiovascular: Cardiomegaly. Right chest wall dual lead pacer with leads in the right atrium and right  ventricle no pericardial effusion. Prior aortic valve replacement and graft repair of the ascending thoracic aorta. Thoracic aortic aneurysm which is most dilated at the area of the distal aortic arch and proximal descending thoracic aorta, measuring up to 8.4 x 7.7 cm. A hyperdense flap is seen at this area of more focal dilation involving the distal arch and proximal descending thoracic aorta. When scout imaging is compared with prior August 17, 2017 chest radiograph there has been marked interval increased dilation. Mediastinum/Nodes: Esophagus thyroid are unremarkable. No pathologically enlarged lymph nodes seen in the chest. Lungs/Pleura: Central airways are patent. Bibasilar atelectasis. Part solid nodule of the right upper lobe measuring 9 x 5 mm on image 40 with 4 mm solid component. No consolidation, pleural effusion or pneumothorax. Upper Abdomen: Cholecystectomy clips. Numerous bilateral simple appearing renal cysts, no specific follow-up imaging is recommended. No acute abnormalities. Musculoskeletal: No chest wall mass or suspicious bone lesions identified. IMPRESSION: 1. Diffusely dilated thoracic aorta status post aortic valve replacement and graft repair of the ascending thoracic aorta with findings consistent with associated acute dissection. Area of more focal dilation measuring up to 8.4 cm at the area of the distal aortic arch and proximal descending thoracic aorta with associated hyperdense dissection flap. 2. Part-solid nodule of the right upper lobe measuring, possibly infectious or inflammatory. Recommend follow-up in 6 months to ensure resolution if consistent with goals of care. 3. Cardiomegaly. Critical Value/emergent results were called by telephone at the time of interpretation on 06/06/2022 at 12:32 pm to provider [facility] , who verbally acknowledged these results. Electronically Signed   By: Yetta Glassman M.D.   On: 06/06/2022 12:46   CT HEAD WO CONTRAST (5MM)  Result  Date: 06/04/2022 CLINICAL DATA:  Mental status change.  Unknown cause. EXAM: CT HEAD WITHOUT CONTRAST TECHNIQUE: Contiguous axial images were obtained from the base of the skull through the vertex without intravenous contrast. RADIATION DOSE REDUCTION: This exam was performed according to the departmental dose-optimization program which includes automated exposure control, adjustment of the mA and/or kV according to patient size and/or use of iterative reconstruction technique. COMPARISON:  CT examination dated May 12, 2022 FINDINGS: Brain: No evidence of acute infarction, hemorrhage, hydrocephalus, extra-axial collection or mass lesion/mass effect. Encephalomalacia of the left frontal lobe and right temporal/parietal lobe unchanged. Mild cerebral atrophy and chronic microvascular ischemic changes of the white matter. Vascular: No hyperdense vessel or unexpected calcification. Skull: Bilateral frontal and parietal burr holes. Sinuses/Orbits: No acute finding. Other: None. IMPRESSION: 1. No acute intracranial abnormality. 2. Encephalomalacia of the left frontal lobe and right temporal/parietal lobe unchanged. 3. Mild cerebral atrophy and chronic microvascular ischemic changes of the white matter. Electronically Signed   By: Keane Police D.O.   On: 06/10/2022 20:38   DG Chest 1 View  Result Date: 06/15/2022 CLINICAL DATA:  ams EXAM: CHEST  1 VIEW COMPARISON:  Chest x-ray 08/17/2017 FINDINGS: Right chest wall dual lead pacemaker in similar position. Interval worsening of an enlarged cardiac silhouette. Aortic valve replacement. Slightly more  prominence aortic arch silhouette. Aortic calcification. No focal consolidation. No pulmonary edema. No pleural effusion. No pneumothorax. No acute osseous abnormality. IMPRESSION: 1. Slightly more prominent aortic arch and cardiac silhouette. Concern for possible increase in aortic aneurysm and possible underlying dissection. Recommend CT angiography chest for further  evaluation. 2.  Aortic Atherosclerosis (ICD10-I70.0). Electronically Signed   By: Iven Finn M.D.   On: 06/02/2022 17:49     Medications:    sodium chloride 125 mL/hr at 06/06/22 1253   norepinephrine (LEVOPHED) Adult infusion 2 mcg/min (06/06/22 1109)    brimonidine  1 drop Both Eyes TID   latanoprost  1 drop Both Eyes QHS   olopatadine  1 drop Both Eyes BID   hydrALAZINE, LORazepam  Assessment/ Plan:  Ms. Katie Mcintosh is a 88 y.o.  female with past medical conditions including metallic aortic valve replacement with chronic anticoagulation, aortic aneurysm, atrial fibrillation, subdural hematoma, CVAs, pacemaker, and chronic kidney disease stage V.  Patient presents to the emergency department with failure to thrive.  She has been admitted for Failure to thrive in adult [R62.7]   Acute Kidney Injury on chronic kidney disease stage 5 with baseline creatinine 5.83 on 05/23/2022.  Acute kidney injury secondary to hypovolemia and cardiorenal syndrome. Chronic kidney disease is secondary to hypertension and advanced age.  Patient has multiple comorbidities and was recently admitted at a [facility] hospital for AKI and multiorgan failure.  Patient with extensive cardiac history and reduced EF.  Somnolence could be explained as uremic symptoms as patient's BUN 90.  Creatinine currently elevated, 6.26 on admission.  Patient would not make an appropriate dialysis patient due to comorbidities.  It is noted in the office note that patient has refused dialysis to multiple providers.    Lab Results  Component Value Date   CREATININE 6.26 (H) 06/06/2022   CREATININE 5.74 (H) 05/31/2022   CREATININE 2.18 (H) 08/17/2017    Intake/Output Summary (Last 24 hours) at 06/06/2022 1431 Last data filed at 06/06/2022 1142 Gross per 24 hour  Intake 1157.5 ml  Output --  Net 1157.5 ml    2. Anemia of chronic kidney disease Lab Results  Component Value Date   HGB 8.5 (L) 06/06/2022   Hemoglobin below desired target.  We will continue to monitor.  3.  Hypercalcemia/hypernatremia, calcium on admission 13.8 and sodium 156. Patient prescribed gentle hydration.  Recommend palliative care consult to evaluate goals of care and provide family support.   LOS: 1   11/13/20232:31 PM

## 2022-06-07 LAB — PARATHYROID HORMONE, INTACT (NO CA): PTH: 228 pg/mL — ABNORMAL HIGH (ref 15–65)

## 2022-06-07 LAB — CALCIUM, IONIZED: Calcium, Ionized, Serum: 7.7 mg/dL — ABNORMAL HIGH (ref 4.5–5.6)

## 2022-06-24 NOTE — Progress Notes (Signed)
Nutrition Brief Note  Chart reviewed. Pt now transitioning to comfort care.  No further nutrition interventions planned at this time.  Please re-consult as needed.   Kinney Sackmann W, RD, LDN, CDCES Registered Dietitian II Certified Diabetes Care and Education Specialist Please refer to [facility] for RD and/or RD on-call/weekend/after hours pager   

## 2022-06-24 NOTE — Progress Notes (Signed)
Called to room by family members. Patient absent of spontaneous breathing, no heart beat noted to auscultation, and no palpable pulses noted. Second verified by Vilma Meckel, RN. Family members at bedside, with more on the way.

## 2022-06-24 NOTE — Progress Notes (Signed)
Multiple family members remain at bedside.

## 2022-06-24 NOTE — Death Summary Note (Signed)
DEATH SUMMARY   Patient Details  Name: DELONDA COLEY MRN: [id number] DOB: September 23, 1933 IZT:[id number], Cherlyn Labella, MD Admission/Discharge Information   Admit Date:  06-15-22  Date of Death: Date of Death: 06-17-2022  Time of Death: Time of Death: 0924  Length of Stay: 2   Principle Cause of death: Acute thoracic aortic dissection  Hospital Diagnoses: Principal Problem:   Acute thoracic aortic dissection ([place]) Active Problems:   Hypovolemic shock ([place])   Acute respiratory failure with hypoxia ([place])   Acute metabolic encephalopathy   AKI (acute kidney injury) ([place]) in the setting of stage V chronic kidney disease   Hypercalcemia   Elevated troponin   Anemia in chronic kidney disease   FTT (failure to thrive) in adult   Failure to thrive in adult   Hospital Course: Patient is an 88 year old female with past medical history of mitral valve repair with mechanical valve on chronic Coumadin, stage V chronic kidney disease with no plans for dialysis, previous history of subdural hemorrhage, severe protein calorie malnutrition and glaucoma who presented to the emergency room for failure to thrive.  According to the patient's son, for the last few days especially, patient is been eating very little and has been declining in her ability to interact.  He did state that she has been taking her medications.    Patient had a recent hospitalization 3 weeks prior at [facility] for [place] UTI and had completed her antibiotic course.  In the emergency room, patient was noted to have a sodium of 156, creatinine of 5.74, hemoglobin of 8.4 and calcium of 13.8.  Admitted to the hospitalist service for failure to thrive.  Following admission, patient started developing hypoxia and hypotension.  Hypotension required pressor support and aggressive fluid resuscitation.  Chest x-ray with noted enlargement of aorta concerning for possible dissection.  Patient underwent chest CT on morning of 11/13 in  discussion with reading radiologist, CT noted diffusely dilated thoracic aorta findings consistent with associated acute dissection and area of more focal dilatation measuring up to 8.4 cm at area of distal aortic arch proximal descending thoracic aorta with associated hyperdense dissection flap.  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  Her comorbidities prevented surgery from being an option.  Patient's husband present, but he himself has underlying Alzheimer's dementia.  He was explained the situation understands, but did not participate in decision-making process.  After discussion amongst themselves, patient's children requested patient be made DNR.  Soon after, they request that she be made comfort care.  Patient transferred to floor given hypoxia, started on morphine drip.  Pressor support discontinued.  Patient passed away on 2023/06/18 at 9:24 AM.  Assessment and Plan: * Acute thoracic aortic dissection [facility]) Patient comfort care.  Family understood that there is little that could be done given comorbidities.  Noted significant dilatation secondary to dissection.  In addition, she was supposed to be on anticoagulation (INR 2.3 on morning of 11/13) to ensure no thrombus.  Patient expired on Jun 18, 2023 morning  Hypovolemic shock (HCC) Pressor support initially, discontinued after comfort care.  Acute respiratory failure with hypoxia (HCC) Unclear etiology.  No evidence of pneumonia or infection.  Patient did receive some fluids initially, but not enough to cause significant volume overload.  If that she is comfort care, will still need to check COVID to ensure no risk of exposure.  Oxygen and morphine for comfort.  Acute metabolic  encephalopathy Multifactorial secondary to hypercalcemia, uremia from worsening kidney injury and overall poor metabolic intake.   AKI (acute kidney injury)  ([place]) in the setting of stage V chronic kidney disease Not a dialysis candidate prior to this.  Worsening kidney injury likely due to hypovolemic shock.  Hypercalcemia Possibly hyperparathyroidism?  Work-up canceled due to patient's comfort care.  Of note, patient's calcium during hospitalization last month at [facility] was elevated, although not to this degree.  Initially was getting aggressive fluid resuscitation which has been discontinued due to comfort care    Elevated troponin Felt to be combination of renal failure and demand ischemia. No further work-up  Anemia in chronic kidney disease    Latest Ref Rng & Units 06/18/2022    4:47 PM 08/17/2017    8:16 PM 03/15/2012    8:45 AM  CBC  WBC 4.0 - 10.5 K/uL 9.6  6.1  4.4   Hemoglobin 12.0 - 15.0 g/dL 8.4  9.6  10.0   Hematocrit 36.0 - 46.0 % 28.2  29.0  31.0   Platelets 150 - 400 K/uL 170  113  121.0   Pt is on coumadin and is at risk for bleeding with her h/o ICH in 2015.  With her current anemia I will start scd. Type and screen.     FTT (failure to thrive) in adult FTT had been steadily increasing over the past few months         Procedures: None  Consultations: None  The results of significant diagnostics from this hospitalization (including imaging, microbiology, ancillary and laboratory) are listed below for reference.   Significant Diagnostic Studies: CT CHEST WO CONTRAST  Result Date: 06/06/2022 CLINICAL DATA:  Possible dissection seen on chest x-ray EXAM: CT CHEST WITHOUT CONTRAST TECHNIQUE: Multidetector CT imaging of the chest was performed following the standard protocol without IV contrast. RADIATION DOSE REDUCTION: This exam was performed according to the departmental dose-optimization program which includes automated exposure control, adjustment of the mA and/or kV according to patient size and/or use of iterative reconstruction technique. COMPARISON:  Chest x-ray dated August 17, 2017 FINDINGS:  Cardiovascular: Cardiomegaly. Right chest wall dual lead pacer with leads in the right atrium and right ventricle no pericardial effusion. Prior aortic valve replacement and graft repair of the ascending thoracic aorta. Thoracic aortic aneurysm which is most dilated at the area of the distal aortic arch and proximal descending thoracic aorta, measuring up to 8.4 x 7.7 cm. A hyperdense flap is seen at this area of more focal dilation involving the distal arch and proximal descending thoracic aorta. When scout imaging is compared with prior August 17, 2017 chest radiograph there has been marked interval increased dilation. Mediastinum/Nodes: Esophagus thyroid are unremarkable. No pathologically enlarged lymph nodes seen in the chest. Lungs/Pleura: Central airways are patent. Bibasilar atelectasis. Part solid nodule of the right upper lobe measuring 9 x 5 mm on image 40 with 4 mm solid component. No consolidation, pleural effusion or pneumothorax. Upper Abdomen: Cholecystectomy clips. Numerous bilateral simple appearing renal cysts, no specific follow-up imaging is recommended. No acute abnormalities. Musculoskeletal: No chest wall mass or suspicious bone lesions identified. IMPRESSION: 1. Diffusely dilated thoracic aorta status post aortic valve replacement and graft repair of the ascending thoracic aorta with findings consistent with associated acute dissection. Area of more focal dilation measuring up to 8.4 cm at the area of the distal aortic arch and proximal descending thoracic aorta with associated hyperdense dissection flap. 2. Part-solid nodule of the right upper  lobe measuring, possibly infectious or inflammatory. Recommend follow-up in 6 months to ensure resolution if consistent with goals of care. 3. Cardiomegaly. Critical Value/emergent results were called by telephone at the time of interpretation on 06/06/2022 at 12:32 pm to provider [facility] , who verbally acknowledged these results.  Electronically Signed   By: Yetta Glassman M.D.   On: 06/06/2022 12:46   CT HEAD WO CONTRAST (5MM)  Result Date: 06/03/2022 CLINICAL DATA:  Mental status change.  Unknown cause. EXAM: CT HEAD WITHOUT CONTRAST TECHNIQUE: Contiguous axial images were obtained from the base of the skull through the vertex without intravenous contrast. RADIATION DOSE REDUCTION: This exam was performed according to the departmental dose-optimization program which includes automated exposure control, adjustment of the mA and/or kV according to patient size and/or use of iterative reconstruction technique. COMPARISON:  CT examination dated May 12, 2022 FINDINGS: Brain: No evidence of acute infarction, hemorrhage, hydrocephalus, extra-axial collection or mass lesion/mass effect. Encephalomalacia of the left frontal lobe and right temporal/parietal lobe unchanged. Mild cerebral atrophy and chronic microvascular ischemic changes of the white matter. Vascular: No hyperdense vessel or unexpected calcification. Skull: Bilateral frontal and parietal burr holes. Sinuses/Orbits: No acute finding. Other: None. IMPRESSION: 1. No acute intracranial abnormality. 2. Encephalomalacia of the left frontal lobe and right temporal/parietal lobe unchanged. 3. Mild cerebral atrophy and chronic microvascular ischemic changes of the white matter. Electronically Signed   By: Keane Police D.O.   On: 06/04/2022 20:38   DG Chest 1 View  Result Date: 06/12/2022 CLINICAL DATA:  ams EXAM: CHEST  1 VIEW COMPARISON:  Chest x-ray 08/17/2017 FINDINGS: Right chest wall dual lead pacemaker in similar position. Interval worsening of an enlarged cardiac silhouette. Aortic valve replacement. Slightly more prominence aortic arch silhouette. Aortic calcification. No focal consolidation. No pulmonary edema. No pleural effusion. No pneumothorax. No acute osseous abnormality. IMPRESSION: 1. Slightly more prominent aortic arch and cardiac silhouette. Concern for  possible increase in aortic aneurysm and possible underlying dissection. Recommend CT angiography chest for further evaluation. 2.  Aortic Atherosclerosis (ICD10-I70.0). Electronically Signed   By: Iven Finn M.D.   On: 06/08/2022 17:49   CT HEAD WO CONTRAST (5MM)  Result Date: 05/12/2022 CLINICAL DATA:  Recent fall with head injury.  On blood thinners. EXAM: CT HEAD WITHOUT CONTRAST TECHNIQUE: Contiguous axial images were obtained from the base of the skull through the vertex without intravenous contrast. RADIATION DOSE REDUCTION: This exam was performed according to the departmental dose-optimization program which includes automated exposure control, adjustment of the mA and/or kV according to patient size and/or use of iterative reconstruction technique. COMPARISON:  CT head 10/16/2019. FINDINGS: Brain: There is no evidence of acute intracranial hemorrhage, mass lesion, brain edema or extra-axial fluid collection. Stable atrophy with prominence of the ventricles and subarachnoid spaces. There is chronic encephalomalacia of the left frontal lobe. Interval development of prominent encephalomalacia in the right temporoparietal lobe. There is no CT evidence of acute cortical infarction. Vascular: Prominent intracranial vascular calcifications. No hyperdense vessel identified. Skull: Negative for fracture or focal lesion. Interval performance of bilateral frontal and parietal burr holes. Sinuses/Orbits: The visualized paranasal sinuses and mastoid air cells are clear. No orbital abnormalities are seen. Other: None. IMPRESSION: 1. No acute intracranial or calvarial findings identified. 2. Interval development of prominent encephalomalacia in the right temporoparietal lobe, consistent with interval infarct. Chronic encephalomalacia of the left frontal lobe. 3. Interval performance of bilateral burr holes. Electronically Signed   By: Caryl Comes.D.  On: 05/12/2022 10:52    Microbiology: Recent Results  (from the past 240 hour(s))  SARS Coronavirus 2 by RT PCR (hospital order, performed in [facility] hospital lab) *cepheid single result test* Anterior Nasal Swab     Status: None   Collection Time: 06/06/22  4:12 PM   Specimen: Anterior Nasal Swab  Result Value Ref Range Status   SARS Coronavirus 2 by RT PCR NEGATIVE NEGATIVE Final    Comment: (NOTE) SARS-CoV-2 target nucleic acids are NOT DETECTED.  The SARS-CoV-2 RNA is generally detectable in upper and lower respiratory specimens during the acute phase of infection. The lowest concentration of SARS-CoV-2 viral copies this assay can detect is 250 copies / mL. A negative result does not preclude SARS-CoV-2 infection and should not be used as the sole basis for treatment or other patient management decisions.  A negative result may occur with improper specimen collection / handling, submission of specimen other than nasopharyngeal swab, presence of viral mutation(s) within the areas targeted by this assay, and inadequate number of viral copies (<250 copies / mL). A negative result must be combined with clinical observations, patient history, and epidemiological information.  Fact Sheet for Patients:   https://www.patel.info/  Fact Sheet for Healthcare Providers: https://hall.com/  This test is not yet approved or  cleared by the [place] FDA and has been authorized for detection and/or diagnosis of SARS-CoV-2 by FDA under an Emergency Use Authorization (EUA).  This EUA will remain in effect (meaning this test can be used) for the duration of the COVID-19 declaration under Section 564(b)(1) of the Act, 21 U.S.C. section 360bbb-3(b)(1), unless the authorization is terminated or revoked sooner.  Performed at [facility], [address] [place], [place] [address]     Time spent: 25 minutes  Signed: Annita Brod, MD 06-15-2022

## 2022-06-24 NOTE — Progress Notes (Signed)
Patient awaiting pick up to morgue

## 2022-06-24 NOTE — Progress Notes (Signed)
Chaplain On-Call responded to a call at 1100 hours from Unit Secretary who reported the patient's death, and the gathering of several family members in the room. (The time of death was noted in the EPIC notes at 97)  Chaplain met eight family members: husband Katie Mcintosh; daughters; son; grandchildren. Katie Mcintosh stated that he and the patient have been married for 69 years. The family spoke about their strong faith, and special memories of the patient.  Chaplain provided spiritual and emotional support and prayer.  Chaplain Pollyann Samples M.Div., [facility]

## 2022-06-24 NOTE — Progress Notes (Signed)
Multiple family members remain at bedside. Still awaiting decisions regarding funeral home, etc.

## 2022-06-24 DEATH — deceased

## 2022-09-26 IMAGING — DX DG SINUSES COMPLETE 3+V
4 series · 4 of 4 positions shown · non-contrast
Comparison: Head CT 10/16/2019.

CLINICAL DATA: Acute pansinusitis.

EXAM:
PARANASAL SINUSES - COMPLETE 3 + VIEW

[PA (1 of 3)]
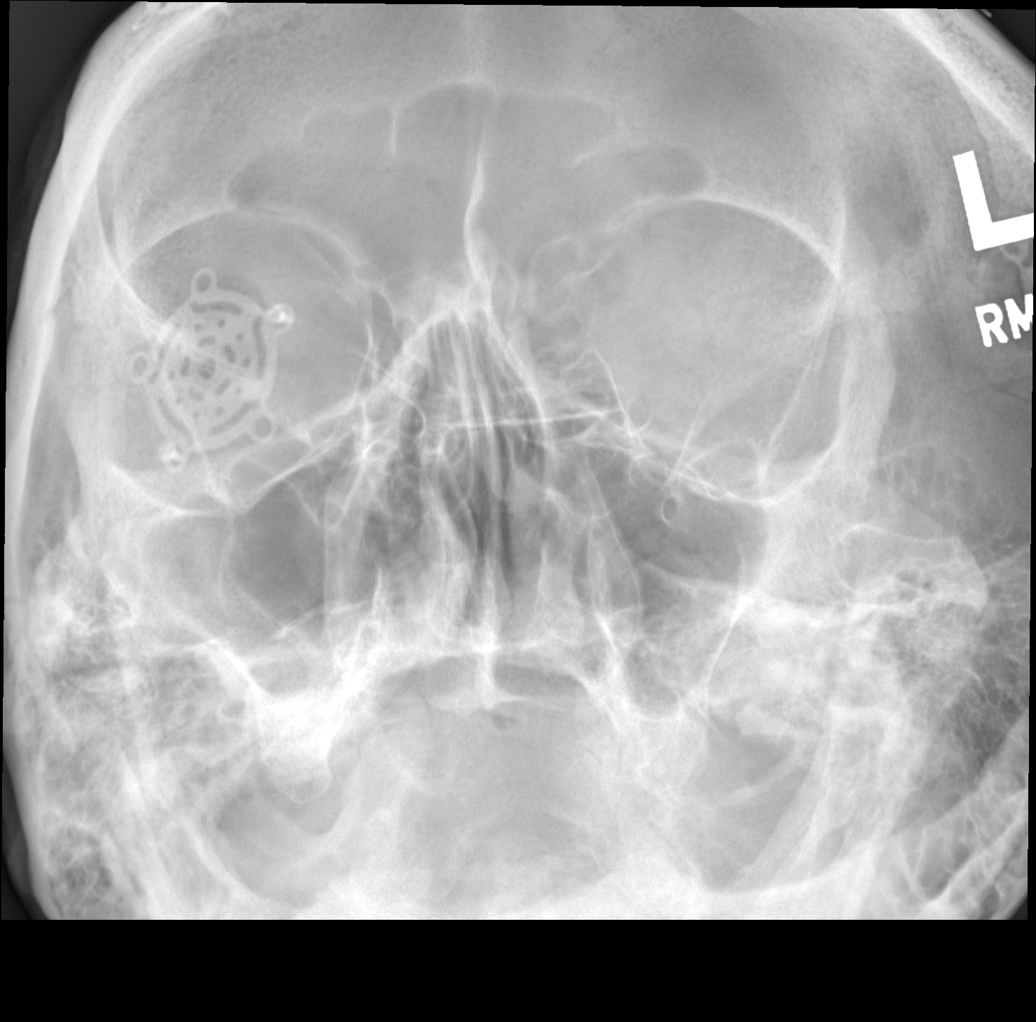

[PA (2 of 3)]
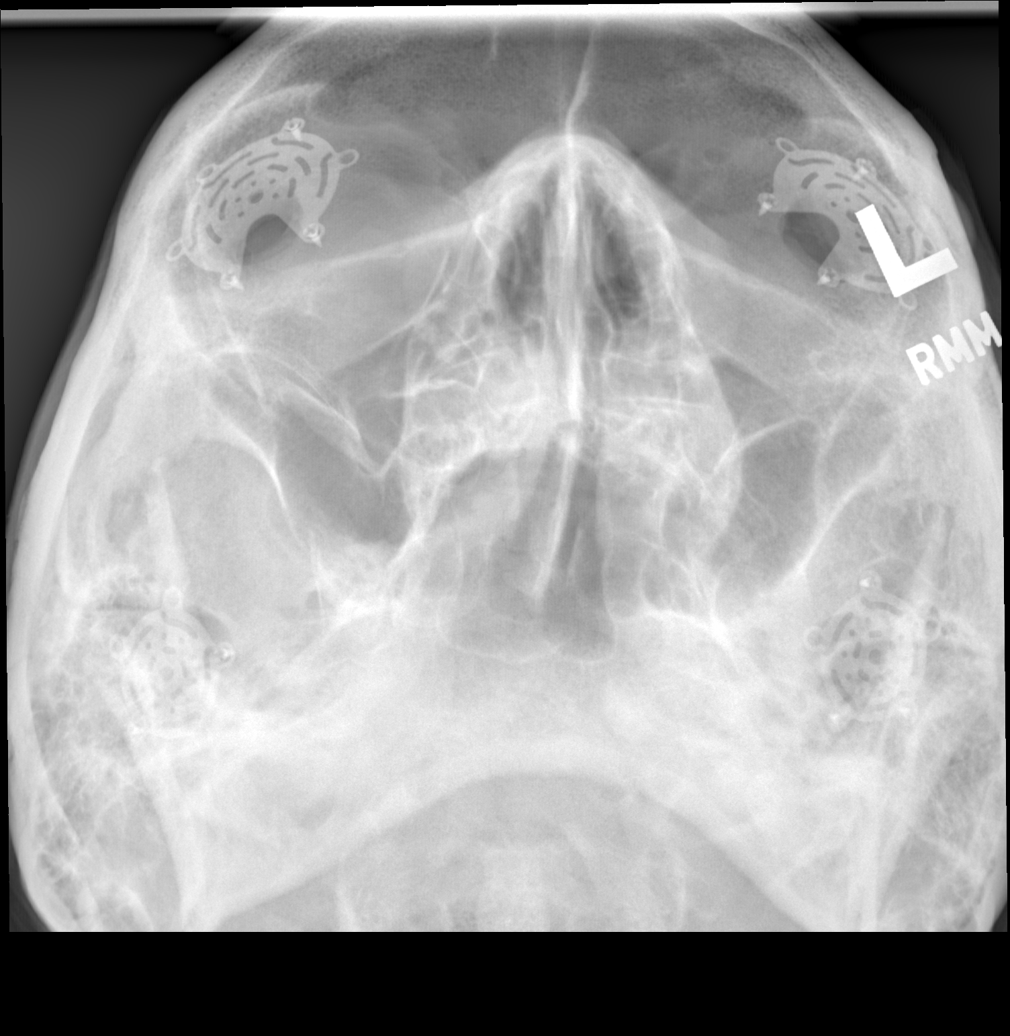

[PA (3 of 3)]
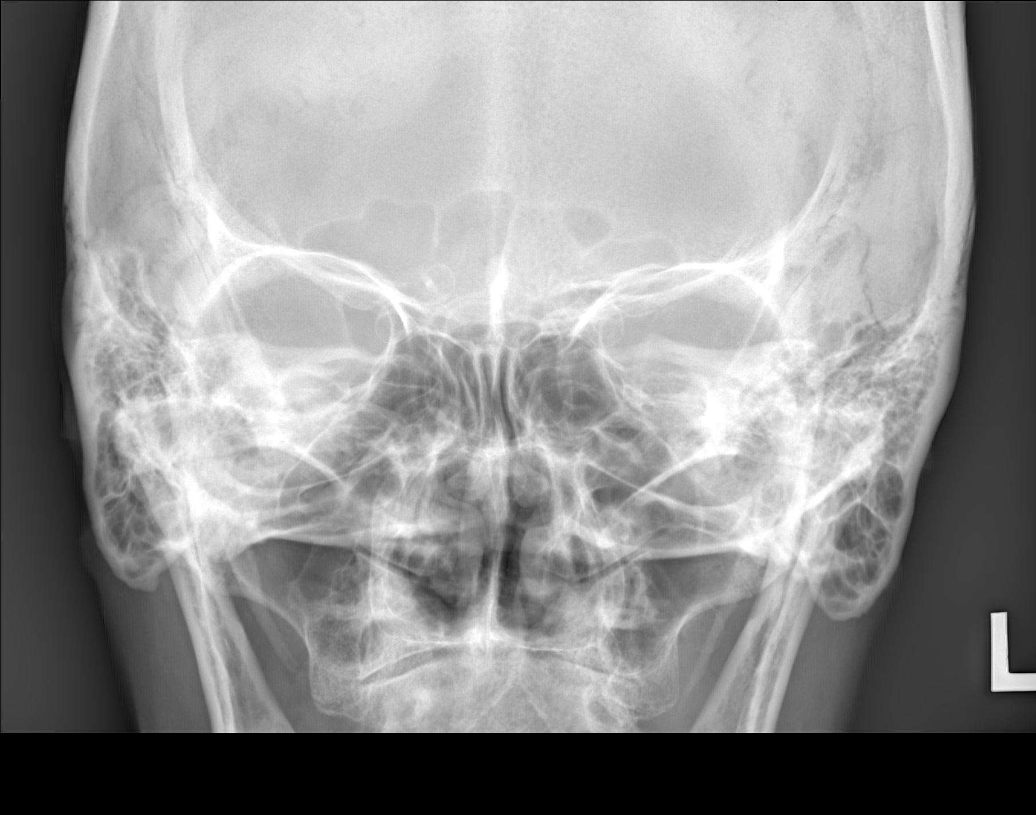

[view not recorded]
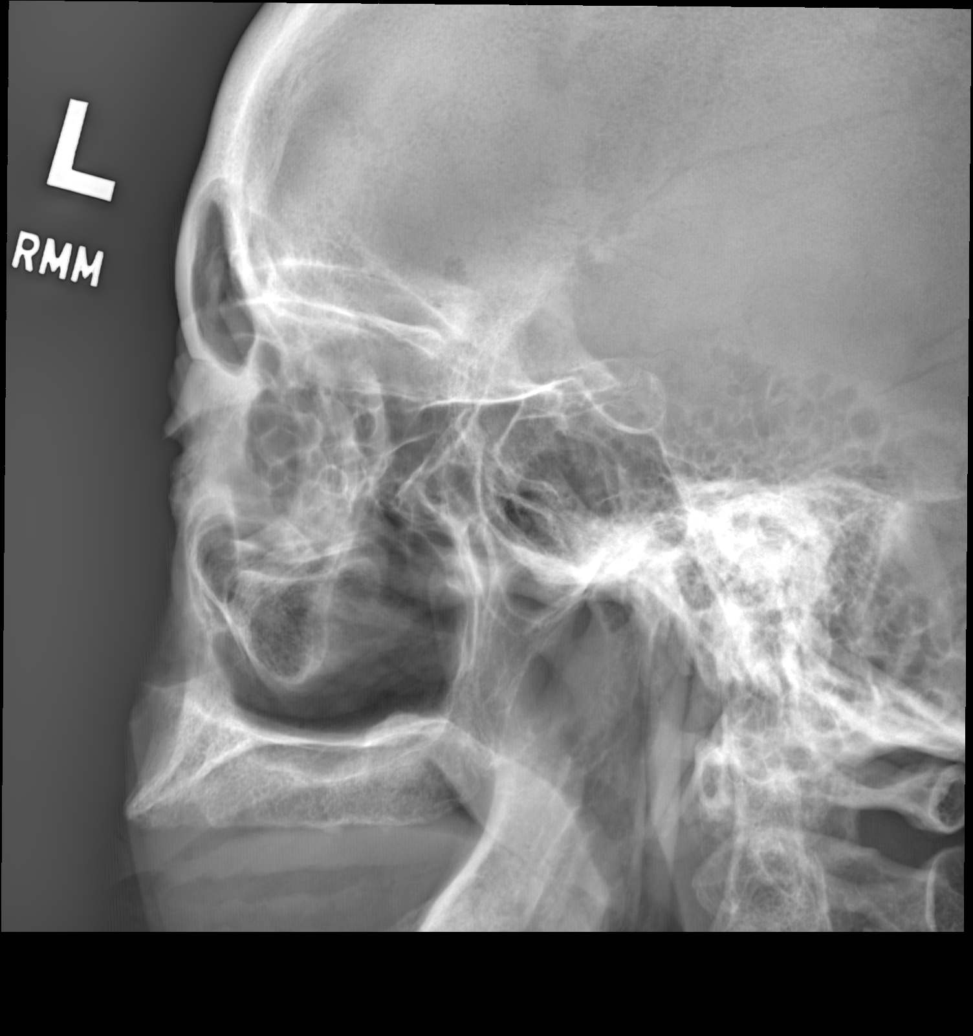

[4 of 4 positions shown; findings below may reference images not displayed]

FINDINGS: Paranasal sinuses are clear. Mastoids are clear. No acute bony
abnormality identified. Intracranial atherosclerotic vascular
calcification noted.
IMPRESSION: 1. Paranasal sinuses and mastoids are clear. No evidence of
sinusitis. No acute bony abnormalities.

2.  Intracranial atherosclerotic vascular calcification noted.
